# Patient Record
Sex: Female | Born: 1935 | Race: White | Hispanic: No | Smoking: Never smoker
Health system: Southern US, Community
[De-identification: ages and names within clinical notes are randomized; demographics above are authoritative.]

## PROBLEM LIST (undated history)

## (undated) DIAGNOSIS — M810 Age-related osteoporosis without current pathological fracture: Secondary | ICD-10-CM

## (undated) DIAGNOSIS — I10 Essential (primary) hypertension: Secondary | ICD-10-CM

## (undated) DIAGNOSIS — R2681 Unsteadiness on feet: Secondary | ICD-10-CM

## (undated) DIAGNOSIS — G40909 Epilepsy, unspecified, not intractable, without status epilepticus: Secondary | ICD-10-CM

## (undated) DIAGNOSIS — Z8509 Personal history of malignant neoplasm of other digestive organs: Secondary | ICD-10-CM

## (undated) DIAGNOSIS — E039 Hypothyroidism, unspecified: Secondary | ICD-10-CM

## (undated) DIAGNOSIS — J3800 Paralysis of vocal cords and larynx, unspecified: Secondary | ICD-10-CM

## (undated) DIAGNOSIS — K859 Acute pancreatitis without necrosis or infection, unspecified: Secondary | ICD-10-CM

## (undated) DIAGNOSIS — D329 Benign neoplasm of meninges, unspecified: Secondary | ICD-10-CM

## (undated) DIAGNOSIS — E222 Syndrome of inappropriate secretion of antidiuretic hormone: Secondary | ICD-10-CM

## (undated) DIAGNOSIS — C439 Malignant melanoma of skin, unspecified: Secondary | ICD-10-CM

## (undated) DIAGNOSIS — Z87440 Personal history of urinary (tract) infections: Secondary | ICD-10-CM

## (undated) DIAGNOSIS — I639 Cerebral infarction, unspecified: Secondary | ICD-10-CM

## (undated) HISTORY — DX: Hypothyroidism, unspecified: E03.9

## (undated) HISTORY — DX: Syndrome of inappropriate secretion of antidiuretic hormone: E22.2

## (undated) HISTORY — DX: Acute pancreatitis without necrosis or infection, unspecified: K85.90

## (undated) HISTORY — DX: Personal history of urinary (tract) infections: Z87.440

## (undated) HISTORY — DX: Essential (primary) hypertension: I10

## (undated) HISTORY — DX: Unsteadiness on feet: R26.81

## (undated) HISTORY — PX: WRIST FRACTURE SURGERY: SHX121

## (undated) HISTORY — DX: Benign neoplasm of meninges, unspecified: D32.9

## (undated) HISTORY — DX: Paralysis of vocal cords and larynx, unspecified: J38.00

## (undated) HISTORY — DX: Malignant melanoma of skin, unspecified: C43.9

## (undated) HISTORY — DX: Personal history of malignant neoplasm of other digestive organs: Z85.09

## (undated) HISTORY — DX: Age-related osteoporosis without current pathological fracture: M81.0

## (undated) HISTORY — DX: Cerebral infarction, unspecified: I63.9

## (undated) HISTORY — PX: BRAIN TUMOR EXCISION: SHX577

## (undated) HISTORY — PX: LARYNGOPLASTY: SHX282

## (undated) HISTORY — DX: Epilepsy, unspecified, not intractable, without status epilepticus: G40.909

## (undated) HISTORY — PX: ABDOMINAL HYSTERECTOMY: SHX81

---

## 1968-08-12 HISTORY — PX: THYROIDECTOMY, PARTIAL: SHX18

## 2001-07-24 ENCOUNTER — Encounter: Admission: RE | Admit: 2001-07-24 | Discharge: 2001-07-24 | Payer: Self-pay | Admitting: Otolaryngology

## 2001-07-24 ENCOUNTER — Encounter: Payer: Self-pay | Admitting: Otolaryngology

## 2002-04-23 ENCOUNTER — Encounter: Payer: Self-pay | Admitting: Otolaryngology

## 2002-04-23 ENCOUNTER — Encounter: Admission: RE | Admit: 2002-04-23 | Discharge: 2002-04-23 | Payer: Self-pay | Admitting: Otolaryngology

## 2002-04-26 ENCOUNTER — Ambulatory Visit (HOSPITAL_BASED_OUTPATIENT_CLINIC_OR_DEPARTMENT_OTHER): Admission: RE | Admit: 2002-04-26 | Discharge: 2002-04-27 | Payer: Self-pay | Admitting: Otolaryngology

## 2002-09-16 ENCOUNTER — Encounter: Admission: RE | Admit: 2002-09-16 | Discharge: 2002-09-16 | Payer: Self-pay | Admitting: Otolaryngology

## 2002-12-30 ENCOUNTER — Encounter: Payer: Self-pay | Admitting: Gastroenterology

## 2003-01-15 ENCOUNTER — Emergency Department (HOSPITAL_COMMUNITY): Admission: EM | Admit: 2003-01-15 | Discharge: 2003-01-15 | Payer: Self-pay | Admitting: *Deleted

## 2003-01-15 ENCOUNTER — Encounter: Payer: Self-pay | Admitting: *Deleted

## 2003-05-13 ENCOUNTER — Encounter: Payer: Self-pay | Admitting: Internal Medicine

## 2003-05-13 ENCOUNTER — Encounter: Admission: RE | Admit: 2003-05-13 | Discharge: 2003-05-13 | Payer: Self-pay | Admitting: Internal Medicine

## 2003-08-26 ENCOUNTER — Encounter: Admission: RE | Admit: 2003-08-26 | Discharge: 2003-08-26 | Payer: Self-pay | Admitting: Internal Medicine

## 2004-04-13 ENCOUNTER — Ambulatory Visit (HOSPITAL_COMMUNITY): Admission: RE | Admit: 2004-04-13 | Discharge: 2004-04-13 | Payer: Self-pay | Admitting: Internal Medicine

## 2004-08-11 ENCOUNTER — Emergency Department (HOSPITAL_COMMUNITY): Admission: EM | Admit: 2004-08-11 | Discharge: 2004-08-11 | Payer: Self-pay

## 2005-08-12 HISTORY — PX: GASTRECTOMY: SHX58

## 2005-11-07 ENCOUNTER — Encounter: Admission: RE | Admit: 2005-11-07 | Discharge: 2005-11-07 | Payer: Self-pay | Admitting: Neurology

## 2006-06-25 ENCOUNTER — Inpatient Hospital Stay (HOSPITAL_COMMUNITY): Admission: EM | Admit: 2006-06-25 | Discharge: 2006-07-14 | Payer: Self-pay | Admitting: Emergency Medicine

## 2006-06-27 ENCOUNTER — Encounter: Payer: Self-pay | Admitting: Gastroenterology

## 2006-06-27 ENCOUNTER — Encounter (INDEPENDENT_AMBULATORY_CARE_PROVIDER_SITE_OTHER): Payer: Self-pay | Admitting: *Deleted

## 2006-06-27 DIAGNOSIS — K2990 Gastroduodenitis, unspecified, without bleeding: Secondary | ICD-10-CM

## 2006-06-27 DIAGNOSIS — K2970 Gastritis, unspecified, without bleeding: Secondary | ICD-10-CM | POA: Insufficient documentation

## 2006-06-30 ENCOUNTER — Encounter: Payer: Self-pay | Admitting: Gastroenterology

## 2006-06-30 ENCOUNTER — Encounter (INDEPENDENT_AMBULATORY_CARE_PROVIDER_SITE_OTHER): Payer: Self-pay | Admitting: Specialist

## 2006-07-02 ENCOUNTER — Encounter (INDEPENDENT_AMBULATORY_CARE_PROVIDER_SITE_OTHER): Payer: Self-pay | Admitting: Specialist

## 2006-07-09 ENCOUNTER — Ambulatory Visit: Payer: Self-pay | Admitting: Internal Medicine

## 2007-01-03 ENCOUNTER — Emergency Department (HOSPITAL_COMMUNITY): Admission: EM | Admit: 2007-01-03 | Discharge: 2007-01-03 | Payer: Self-pay | Admitting: Emergency Medicine

## 2007-08-15 IMAGING — CR DG CHEST 1V PORT
1 series · 1 of 1 positions shown · non-contrast
Comparison: 07/01/06

CLINICAL DATA: Recent gastric tumor resection.  Check for fluid overload.  
 PORTABLE CHEST ? 1 VIEW:

[view not recorded]
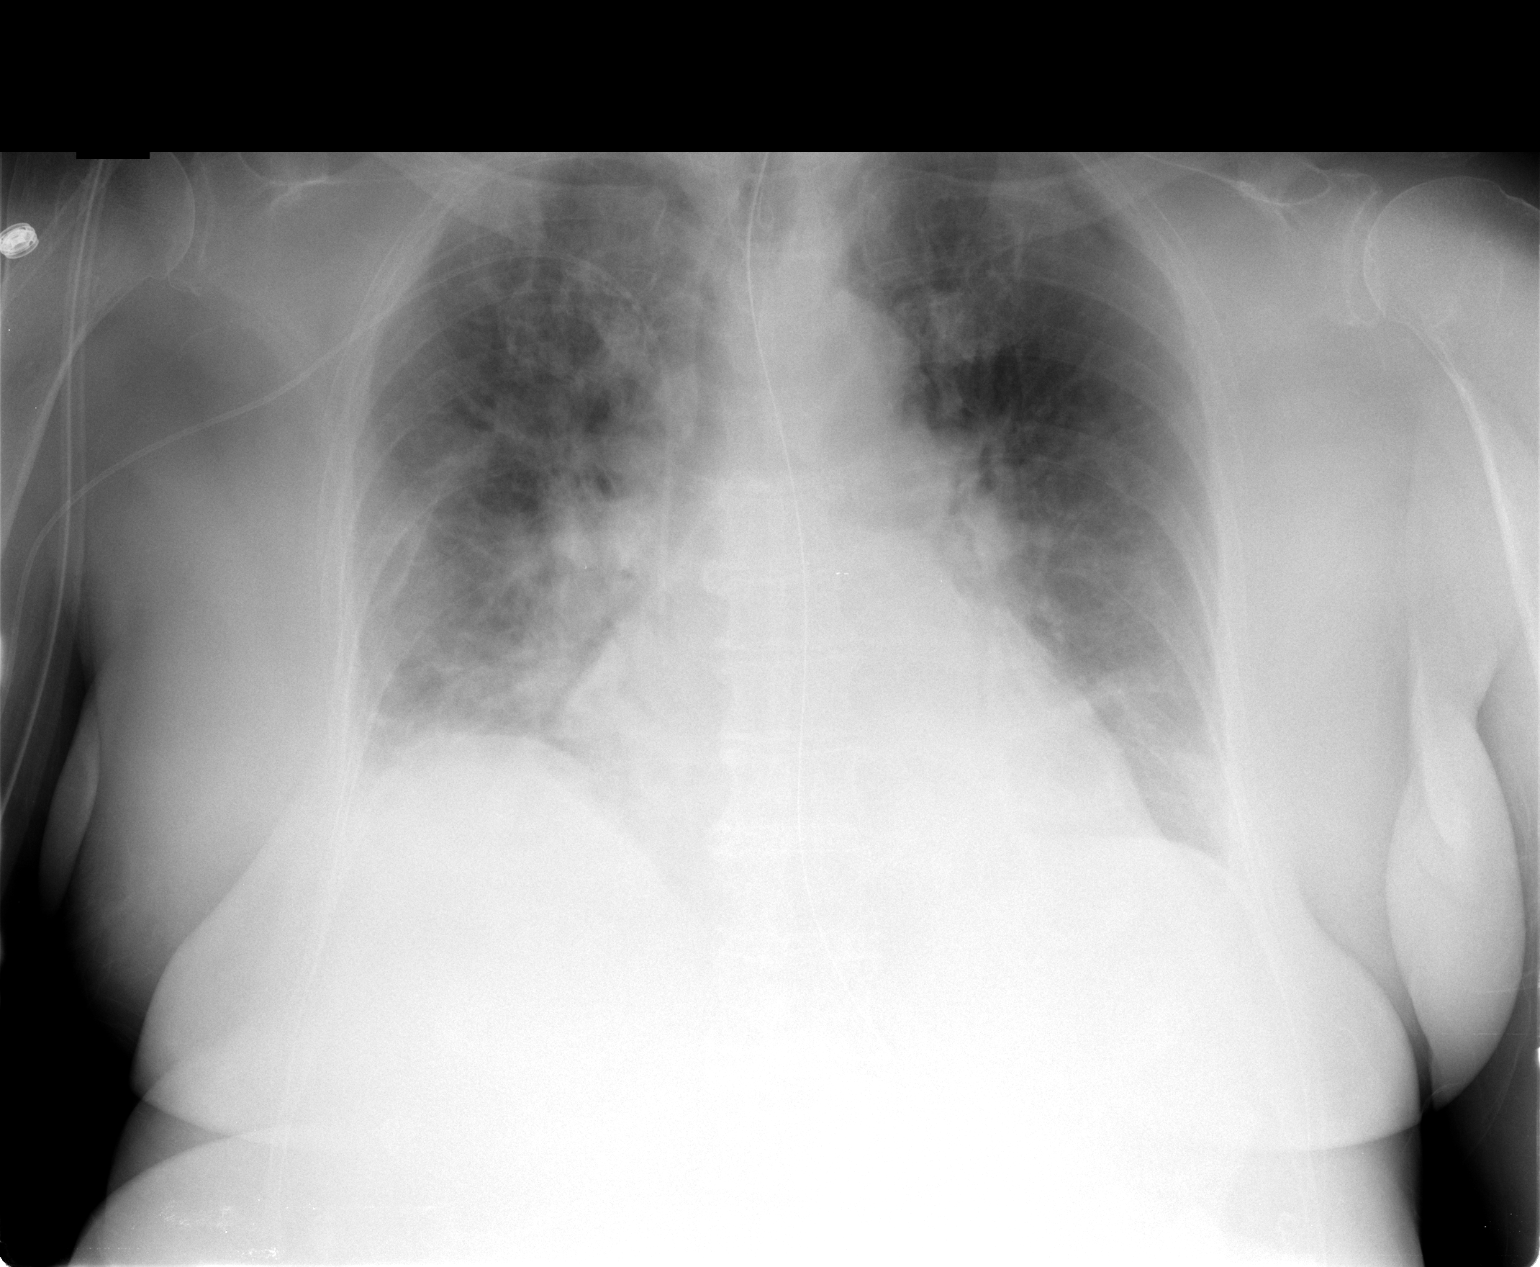

[1 of 1 positions shown; findings below may reference images not displayed]

FINDINGS: Right sided PICC line terminates over the low SVC just above the superior caval/atrial junction.  There has been placement of a nasogastric tube.  This extends beyond the inferior aspect of the film.  Cardiomegaly.  Development of moderate interstitial edema with right and likely left pleural effusions.  No pneumothorax.  Right base atelectasis.
IMPRESSION: 1.  Development of moderate interstitial edema with probable bilateral pleural effusions.  
 2.  Right base atelectasis.

## 2007-08-17 IMAGING — CR DG CHEST 2V
2 series · 2 of 2 positions shown · non-contrast
Comparison: 07/05/06.

CLINICAL DATA: Pulmonary edema.  Partial gastrectomy for GI bleed and gastric tumor.  
 CHEST - 2 VIEW:

[w chest pa]
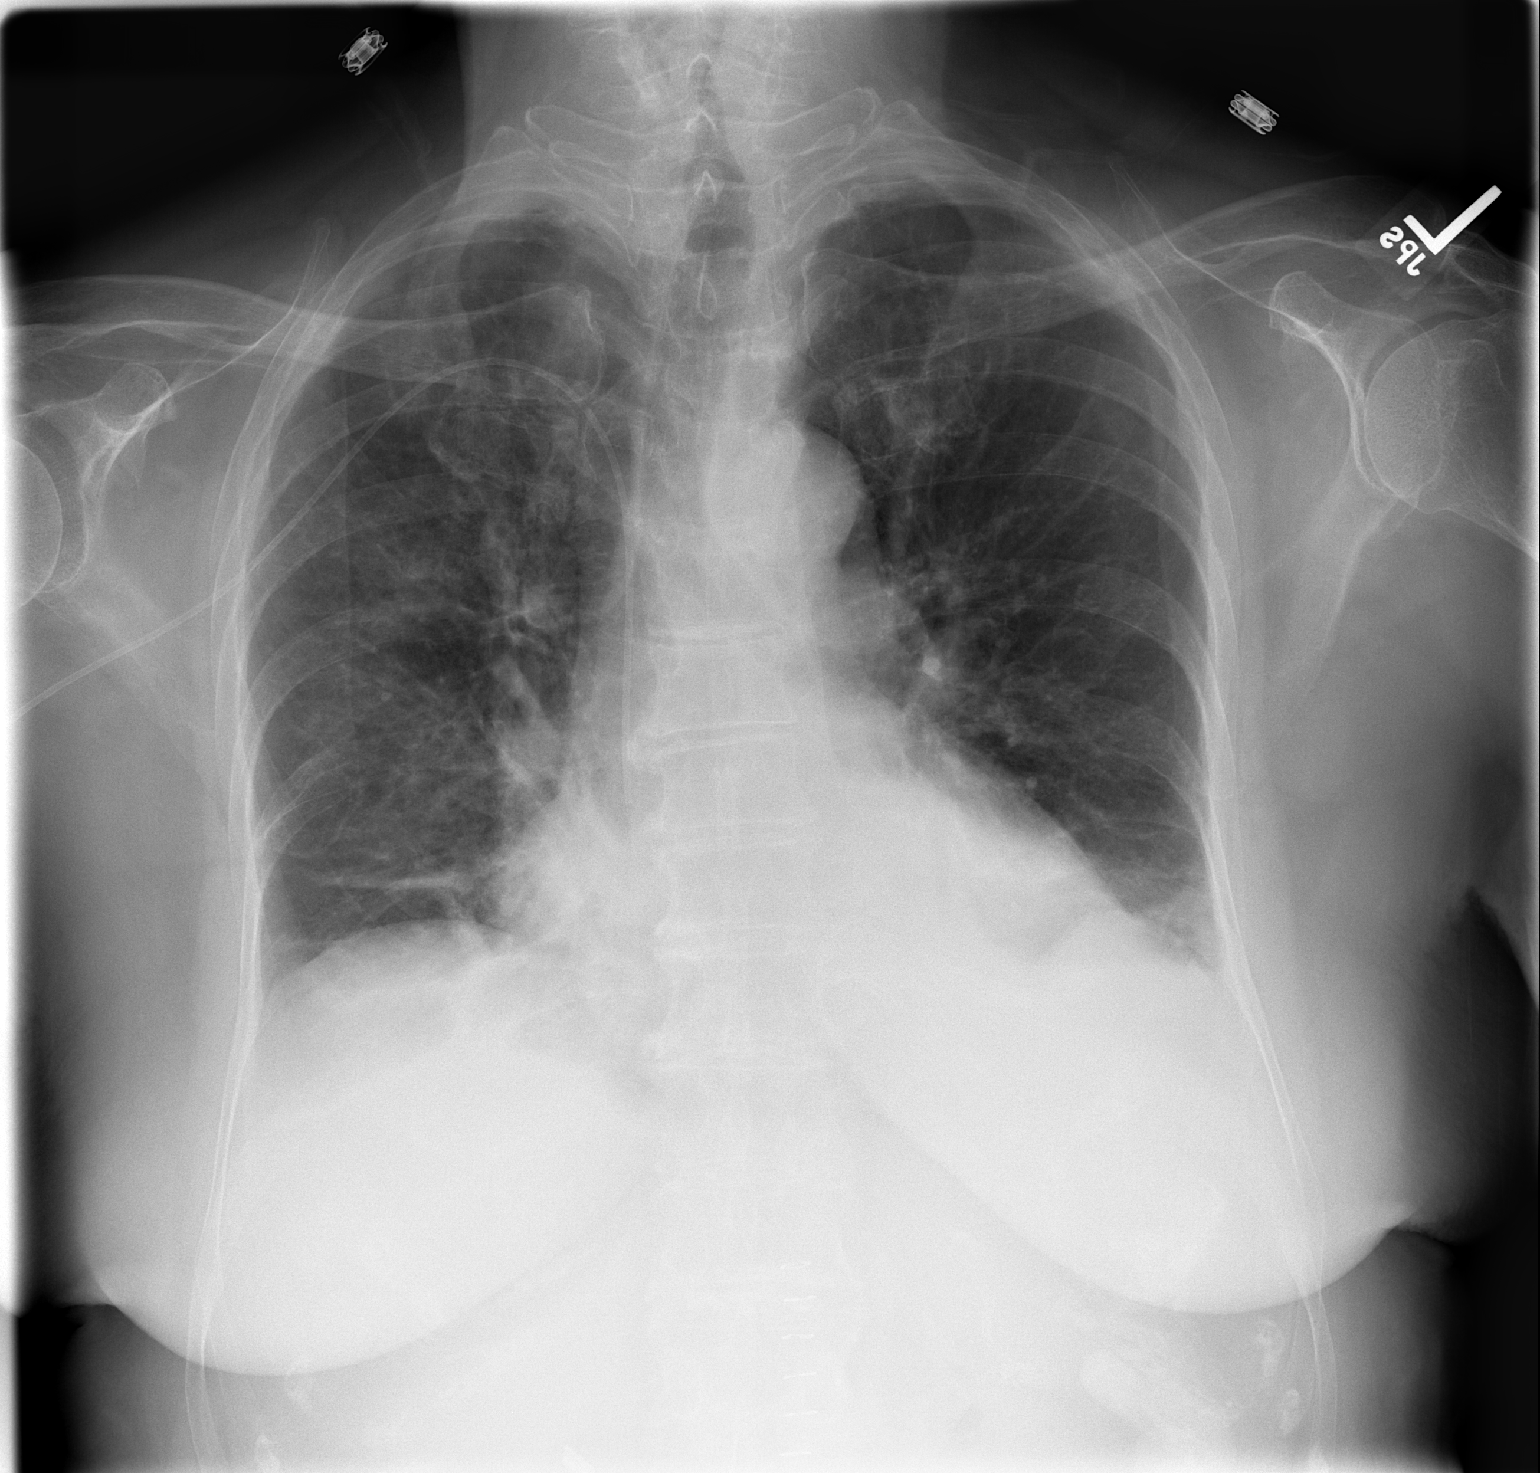

[w chest lat]
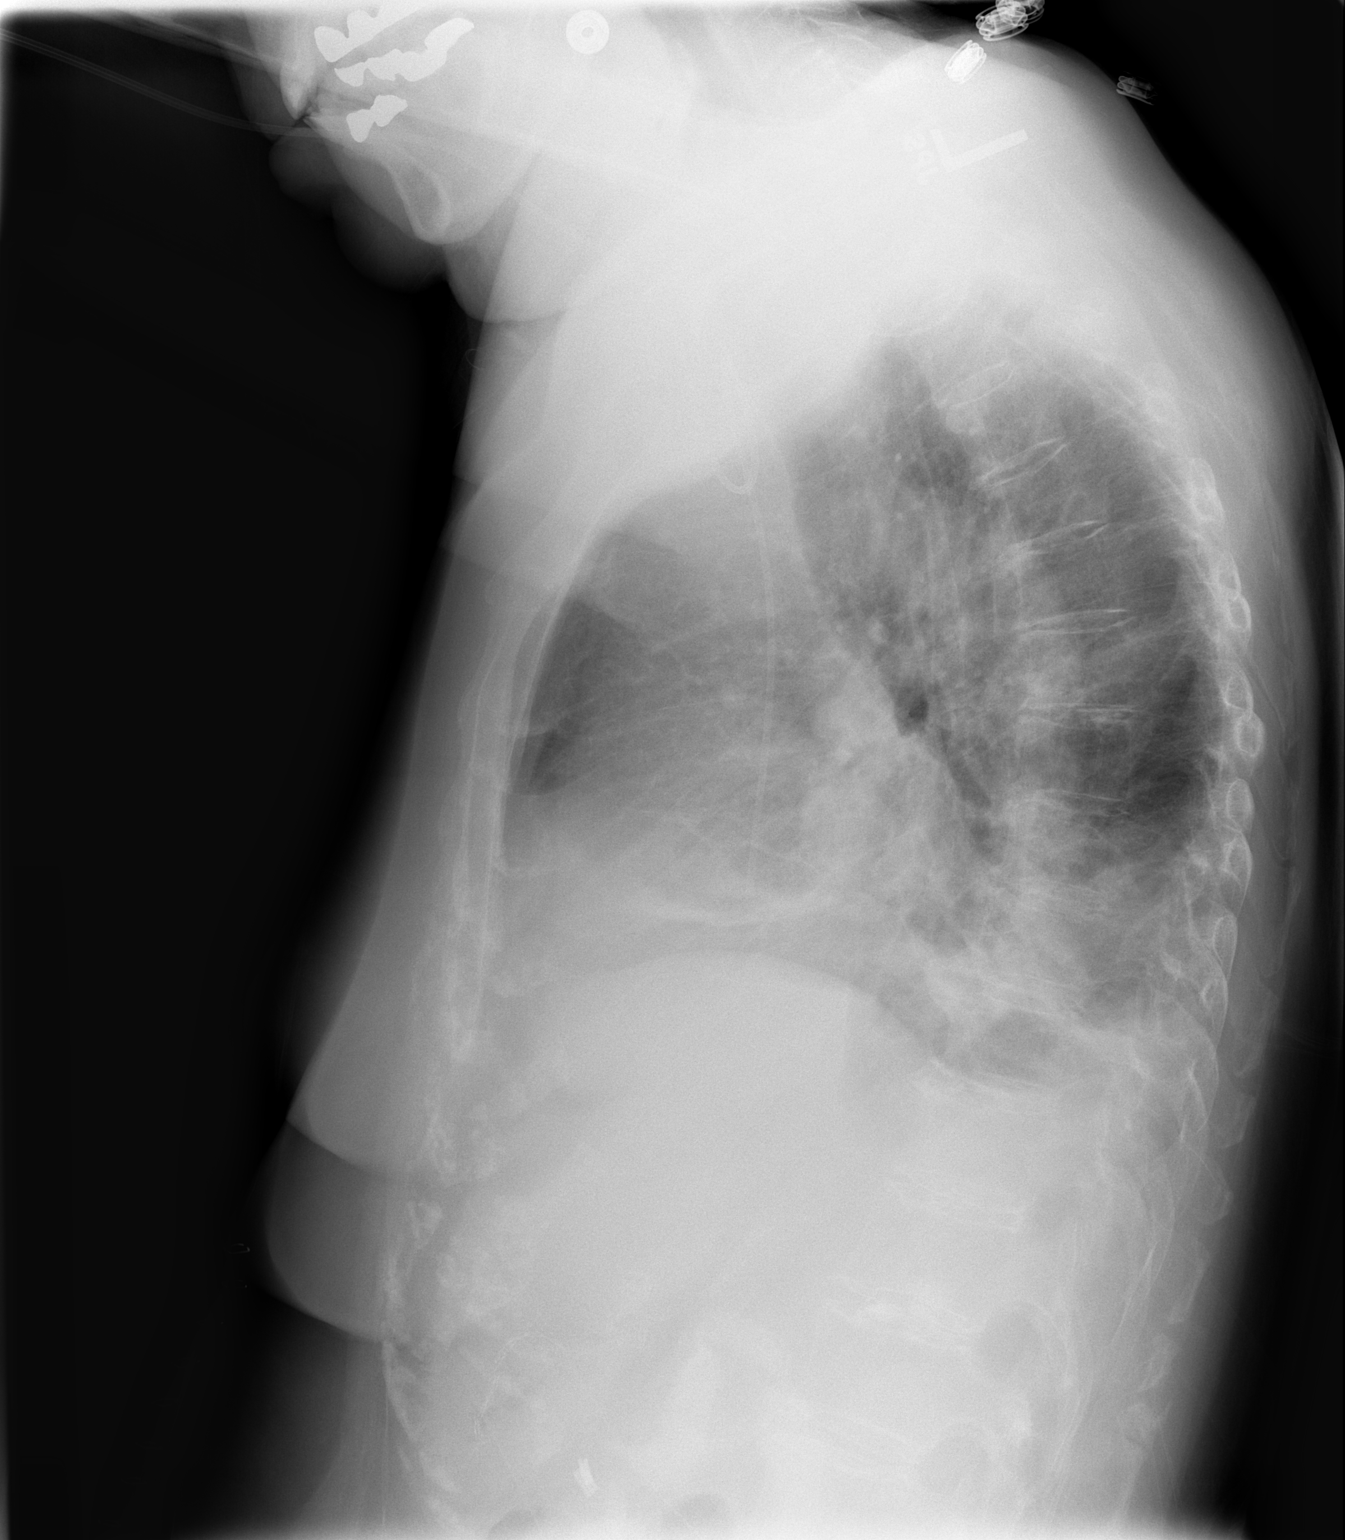

[2 of 2 positions shown; findings below may reference images not displayed]

FINDINGS: Pulmonary edema has resolved.  Underlying chronic lung disease and bibasilar atelectasis remain.  There are small bilateral pleural effusions, left greater than right.  Stable cardiomegaly.
IMPRESSION: Resolution of pulmonary edema.  Bilateral pleural effusions remain present, which are relatively small.  Bibasilar atelectasis.

## 2007-08-18 IMAGING — CR DG ABDOMEN 2V
2 series · 2 of 2 positions shown · non-contrast
Comparison: Preoperative abdominal CT 06/27/06.

CLINICAL DATA: Postop incisional pain. Question ileus vs obstruction.
 ABDOMEN ? 2 VIEW:

[t abdomen supine]
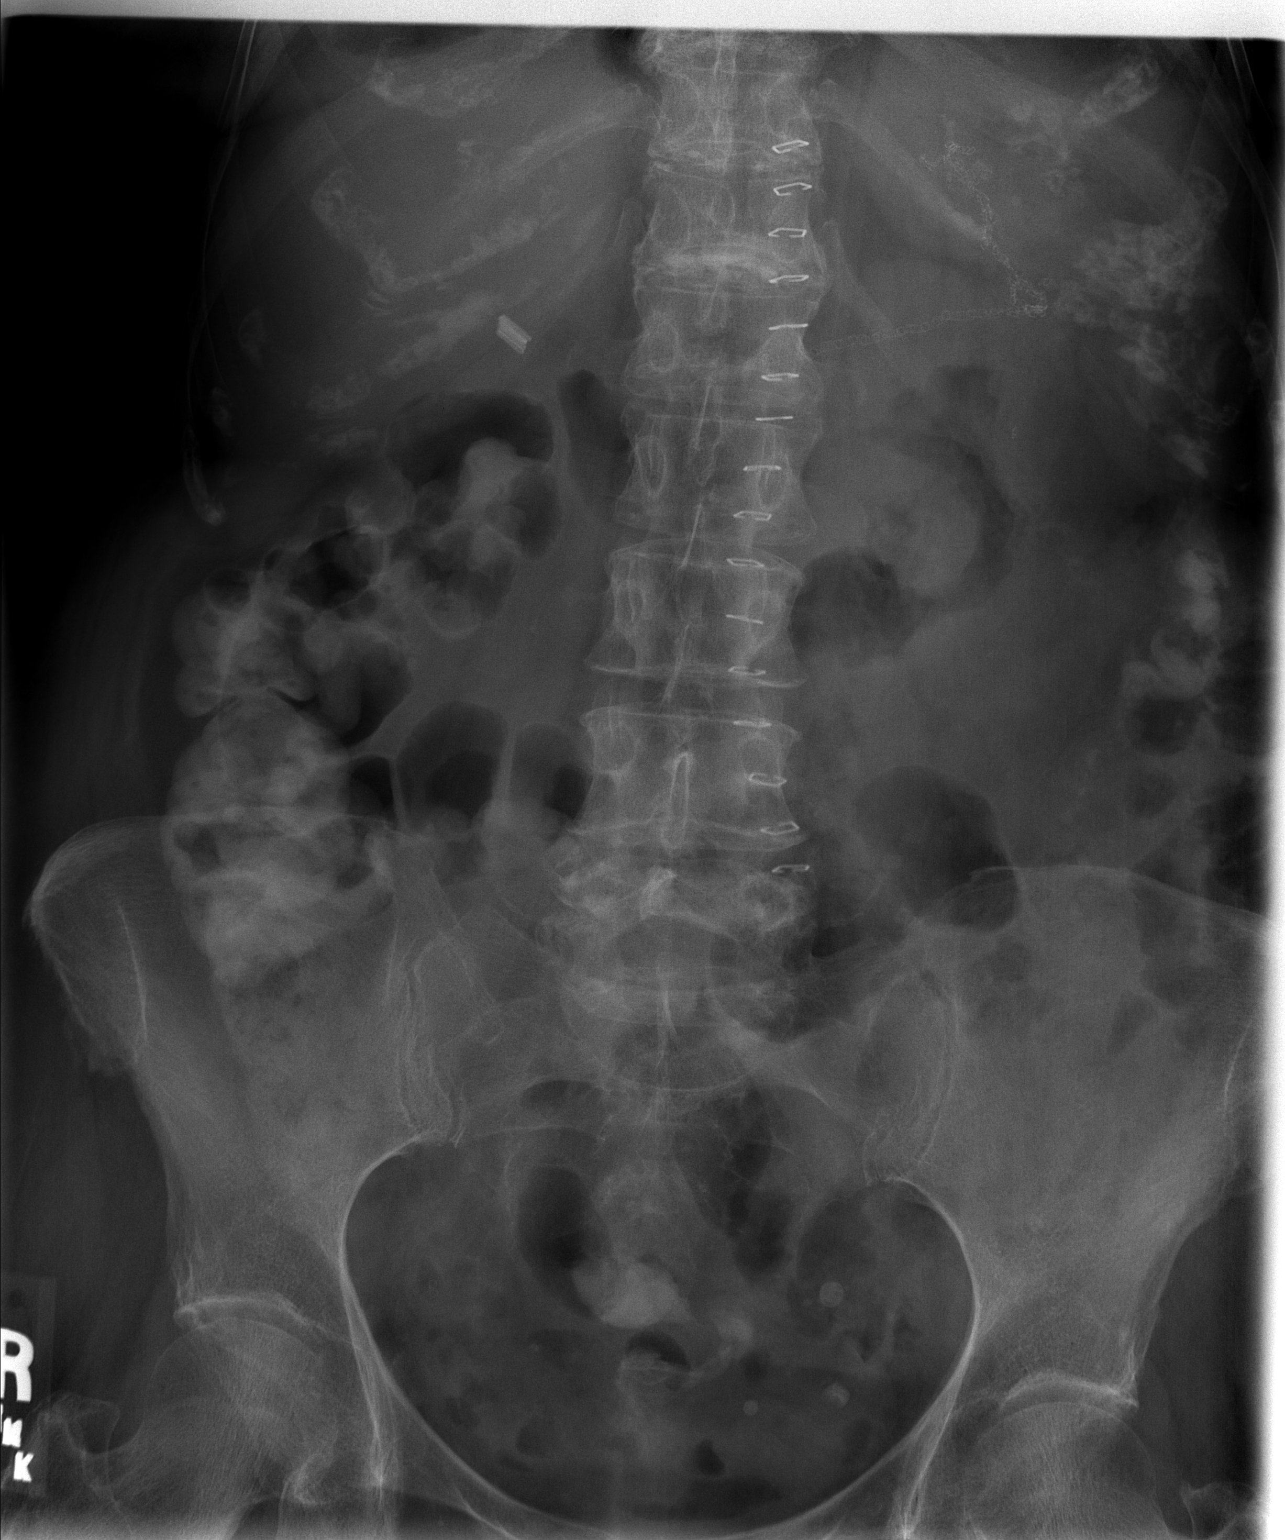

[w abdomen upright]
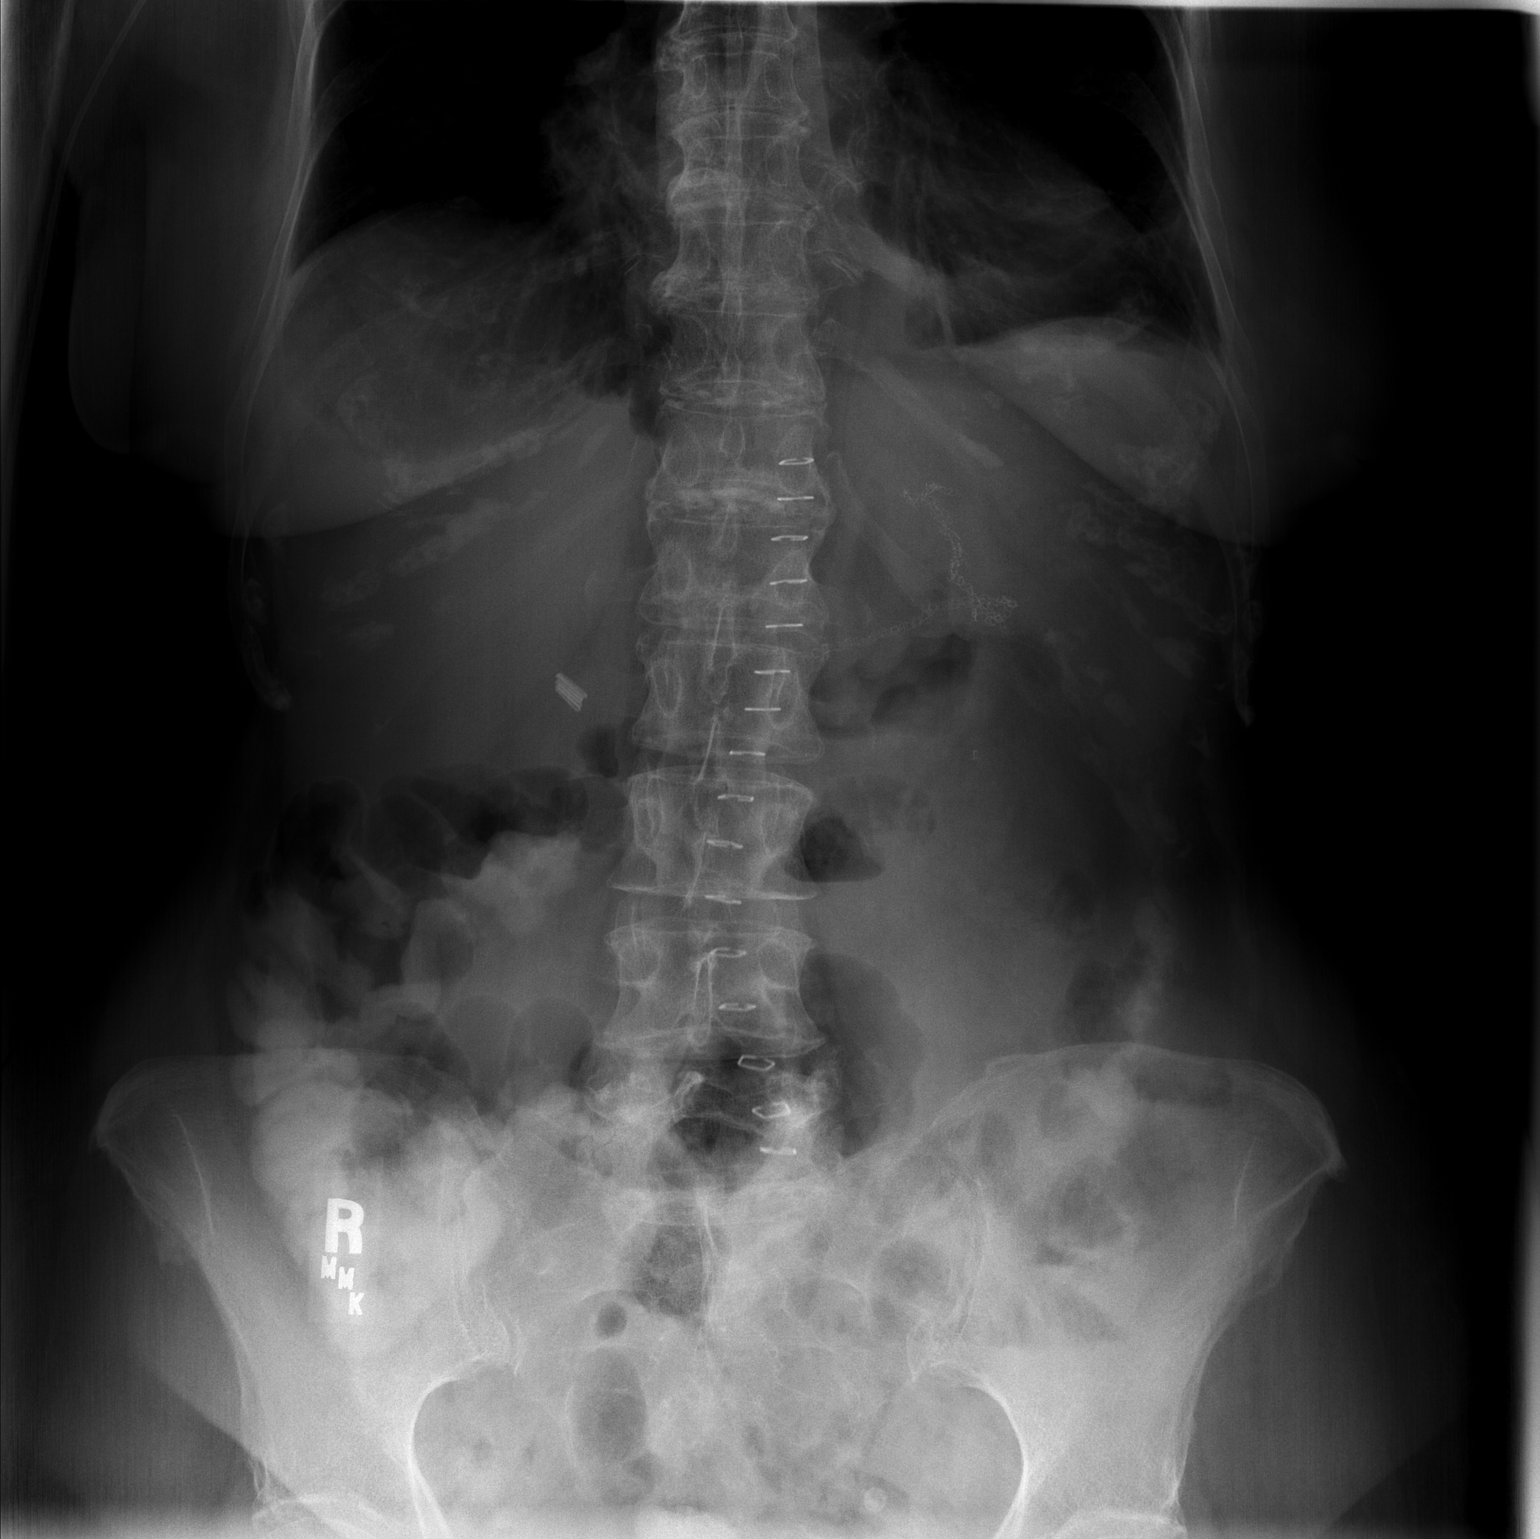

[2 of 2 positions shown; findings below may reference images not displayed]

FINDINGS: There are midline anterior abdominal wall staples with surgical clips in the left upper quadrant of the abdomen.  The bowel gas pattern is normal. There is no free intraperitoneal air.  Some residual bowel contrast is noted in the colon.  There are bilateral pelvic phleboliths.
IMPRESSION: Normal bowel gas pattern status post recent abdominal surgery for left upper quadrant mass. No evidence of obstruction.

## 2007-10-11 HISTORY — PX: CHOLECYSTECTOMY: SHX55

## 2007-12-08 ENCOUNTER — Ambulatory Visit: Payer: Self-pay | Admitting: Gastroenterology

## 2007-12-08 DIAGNOSIS — R110 Nausea: Secondary | ICD-10-CM

## 2007-12-08 DIAGNOSIS — D131 Benign neoplasm of stomach: Secondary | ICD-10-CM

## 2007-12-08 DIAGNOSIS — R1310 Dysphagia, unspecified: Secondary | ICD-10-CM | POA: Insufficient documentation

## 2007-12-17 ENCOUNTER — Encounter: Payer: Self-pay | Admitting: Gastroenterology

## 2007-12-17 ENCOUNTER — Ambulatory Visit: Payer: Self-pay | Admitting: Gastroenterology

## 2007-12-17 LAB — CONVERTED CEMR LAB: BUN: 11 mg/dL (ref 6–23)

## 2007-12-21 ENCOUNTER — Encounter: Payer: Self-pay | Admitting: Gastroenterology

## 2007-12-28 ENCOUNTER — Ambulatory Visit: Payer: Self-pay | Admitting: Cardiology

## 2008-01-13 ENCOUNTER — Ambulatory Visit: Payer: Self-pay | Admitting: Gastroenterology

## 2008-09-04 ENCOUNTER — Emergency Department (HOSPITAL_COMMUNITY): Admission: EM | Admit: 2008-09-04 | Discharge: 2008-09-04 | Payer: Self-pay | Admitting: Emergency Medicine

## 2009-03-16 ENCOUNTER — Encounter: Admission: RE | Admit: 2009-03-16 | Discharge: 2009-03-16 | Payer: Self-pay | Admitting: Internal Medicine

## 2009-04-18 ENCOUNTER — Encounter: Admission: RE | Admit: 2009-04-18 | Discharge: 2009-04-18 | Payer: Self-pay | Admitting: Internal Medicine

## 2009-04-21 ENCOUNTER — Ambulatory Visit: Payer: Self-pay | Admitting: Surgery

## 2009-04-21 ENCOUNTER — Encounter (INDEPENDENT_AMBULATORY_CARE_PROVIDER_SITE_OTHER): Payer: Self-pay | Admitting: Emergency Medicine

## 2009-04-21 ENCOUNTER — Inpatient Hospital Stay (HOSPITAL_COMMUNITY): Admission: EM | Admit: 2009-04-21 | Discharge: 2009-04-24 | Payer: Self-pay | Admitting: Emergency Medicine

## 2009-04-22 ENCOUNTER — Encounter (INDEPENDENT_AMBULATORY_CARE_PROVIDER_SITE_OTHER): Payer: Self-pay | Admitting: Internal Medicine

## 2009-08-12 HISTORY — PX: CATARACT EXTRACTION, BILATERAL: SHX1313

## 2009-08-12 HISTORY — PX: MELANOMA EXCISION: SHX5266

## 2009-09-27 ENCOUNTER — Ambulatory Visit: Admission: RE | Admit: 2009-09-27 | Discharge: 2009-09-27 | Payer: Self-pay | Admitting: Gynecologic Oncology

## 2009-10-03 ENCOUNTER — Ambulatory Visit (HOSPITAL_BASED_OUTPATIENT_CLINIC_OR_DEPARTMENT_OTHER): Admission: RE | Admit: 2009-10-03 | Discharge: 2009-10-03 | Payer: Self-pay | Admitting: General Surgery

## 2009-10-24 ENCOUNTER — Ambulatory Visit (HOSPITAL_COMMUNITY): Admission: RE | Admit: 2009-10-24 | Discharge: 2009-10-24 | Payer: Self-pay | Admitting: Gynecologic Oncology

## 2009-11-22 ENCOUNTER — Ambulatory Visit: Admission: RE | Admit: 2009-11-22 | Discharge: 2009-11-22 | Payer: Self-pay | Admitting: Gynecologic Oncology

## 2010-10-31 LAB — PROTIME-INR: Prothrombin Time: 13.3 seconds (ref 11.6–15.2)

## 2010-10-31 LAB — BASIC METABOLIC PANEL
BUN: 12 mg/dL (ref 6–23)
Chloride: 101 mEq/L (ref 96–112)
Creatinine, Ser: 0.7 mg/dL (ref 0.4–1.2)
GFR calc non Af Amer: >60 mL/min (ref >60)
Glucose, Bld: 97 mg/dL (ref 70–99)
Potassium: 3.7 mEq/L (ref 3.5–5.1)

## 2010-10-31 LAB — DIFFERENTIAL
Basophils Absolute: 0 10*3/uL (ref 0.0–0.1)
Eosinophils Absolute: 0.2 10*3/uL (ref 0.0–0.7)
Eosinophils Relative: 4 % (ref 0–5)

## 2010-10-31 LAB — CBC
HCT: 35.7 % — ABNORMAL LOW (ref 36.0–46.0)
MCV: 99.1 fL (ref 78.0–100.0)
Platelets: 183 10*3/uL (ref 150–400)
RDW: 13.5 % (ref 11.5–15.5)

## 2010-11-01 ENCOUNTER — Ambulatory Visit
Admission: RE | Admit: 2010-11-01 | Discharge: 2010-11-01 | Disposition: A | Discharge status: Home / Self Care | Payer: Medicare Other | Source: Ambulatory Visit | Attending: Neurology | Admitting: Neurology

## 2010-11-01 ENCOUNTER — Other Ambulatory Visit: Payer: Self-pay | Admitting: Neurology

## 2010-11-01 DIAGNOSIS — T887XXA Unspecified adverse effect of drug or medicament, initial encounter: Secondary | ICD-10-CM

## 2010-11-01 DIAGNOSIS — S0990XA Unspecified injury of head, initial encounter: Secondary | ICD-10-CM

## 2010-11-01 DIAGNOSIS — S060XAA Concussion with loss of consciousness status unknown, initial encounter: Secondary | ICD-10-CM

## 2010-11-01 DIAGNOSIS — S060X9A Concussion with loss of consciousness of unspecified duration, initial encounter: Secondary | ICD-10-CM

## 2010-11-01 DIAGNOSIS — S06339A Contusion and laceration of cerebrum, unspecified, with loss of consciousness of unspecified duration, initial encounter: Secondary | ICD-10-CM

## 2010-11-01 DIAGNOSIS — R51 Headache: Secondary | ICD-10-CM

## 2010-11-01 DIAGNOSIS — I6200 Nontraumatic subdural hemorrhage, unspecified: Secondary | ICD-10-CM

## 2010-11-01 DIAGNOSIS — G40219 Localization-related (focal) (partial) symptomatic epilepsy and epileptic syndromes with complex partial seizures, intractable, without status epilepticus: Secondary | ICD-10-CM

## 2010-11-01 DIAGNOSIS — D332 Benign neoplasm of brain, unspecified: Secondary | ICD-10-CM

## 2010-11-01 DIAGNOSIS — G40309 Generalized idiopathic epilepsy and epileptic syndromes, not intractable, without status epilepticus: Secondary | ICD-10-CM

## 2010-11-04 LAB — COMPREHENSIVE METABOLIC PANEL
AST: 18 U/L (ref 0–37)
Albumin: 3.9 g/dL (ref 3.5–5.2)
Calcium: 8.4 mg/dL (ref 8.4–10.5)
Creatinine, Ser: 0.75 mg/dL (ref 0.4–1.2)
GFR calc Af Amer: >60 mL/min (ref >60)
GFR calc non Af Amer: >60 mL/min (ref >60)
Total Protein: 6.7 g/dL (ref 6.0–8.3)

## 2010-11-04 LAB — CBC
MCHC: 32.6 g/dL (ref 30.0–36.0)
MCV: 99.7 fL (ref 78.0–100.0)
Platelets: 173 10*3/uL (ref 150–400)
RDW: 13.1 % (ref 11.5–15.5)

## 2010-11-04 LAB — DIFFERENTIAL
Eosinophils Relative: 4 % (ref 0–5)
Lymphocytes Relative: 16 % (ref 12–46)
Lymphs Abs: 0.9 10*3/uL (ref 0.7–4.0)
Monocytes Absolute: 0.6 10*3/uL (ref 0.1–1.0)

## 2010-11-05 ENCOUNTER — Other Ambulatory Visit: Payer: Self-pay

## 2010-11-16 LAB — URINALYSIS, ROUTINE W REFLEX MICROSCOPIC
Bilirubin Urine: NEGATIVE
Glucose, UA: NEGATIVE mg/dL
Glucose, UA: NEGATIVE mg/dL
Ketones, ur: NEGATIVE mg/dL
Nitrite: NEGATIVE
Specific Gravity, Urine: 1.008 (ref 1.005–1.030)
Specific Gravity, Urine: 1.012 (ref 1.005–1.030)
pH: 7 (ref 5.0–8.0)
pH: 8 (ref 5.0–8.0)

## 2010-11-16 LAB — PHENYTOIN LEVEL, TOTAL
Phenytoin Lvl: 12.6 ug/mL (ref 10.0–20.0)
Phenytoin Lvl: 5.7 ug/mL — ABNORMAL LOW (ref 10.0–20.0)

## 2010-11-16 LAB — BASIC METABOLIC PANEL
CO2: 22 mEq/L (ref 19–32)
Chloride: 96 mEq/L (ref 96–112)
Creatinine, Ser: 0.57 mg/dL (ref 0.4–1.2)
GFR calc Af Amer: >60 mL/min (ref >60)

## 2010-11-16 LAB — URINE CULTURE
Colony Count: NO GROWTH
Culture: NO GROWTH

## 2010-11-16 LAB — URINE MICROSCOPIC-ADD ON

## 2010-11-16 LAB — HEPATIC FUNCTION PANEL
ALT: 14 U/L (ref 0–35)
AST: 24 U/L (ref 0–37)
Alkaline Phosphatase: 103 U/L (ref 39–117)
Bilirubin, Direct: 0.1 mg/dL (ref 0.0–0.3)
Indirect Bilirubin: 0.3 mg/dL (ref 0.3–0.9)
Total Bilirubin: 0.4 mg/dL (ref 0.3–1.2)

## 2010-11-16 LAB — COMPREHENSIVE METABOLIC PANEL
ALT: 12 U/L (ref 0–35)
Albumin: 3.6 g/dL (ref 3.5–5.2)
Alkaline Phosphatase: 84 U/L (ref 39–117)
Calcium: 8.1 mg/dL — ABNORMAL LOW (ref 8.4–10.5)
GFR calc Af Amer: >60 mL/min (ref >60)
Potassium: 3.8 mEq/L (ref 3.5–5.1)
Sodium: 135 mEq/L (ref 135–145)
Total Protein: 5.8 g/dL — ABNORMAL LOW (ref 6.0–8.3)

## 2010-11-16 LAB — DIFFERENTIAL
Basophils Relative: 1 % (ref 0–1)
Eosinophils Absolute: 0.1 10*3/uL (ref 0.0–0.7)
Eosinophils Relative: 2 % (ref 0–5)
Monocytes Absolute: 0.4 10*3/uL (ref 0.1–1.0)
Monocytes Relative: 10 % (ref 3–12)
Neutrophils Relative %: 72 % (ref 43–77)

## 2010-11-16 LAB — LIPID PANEL
Cholesterol: 226 mg/dL — ABNORMAL HIGH (ref 0–200)
HDL: 111 mg/dL (ref >39)
Total CHOL/HDL Ratio: 2 RATIO
VLDL: 8 mg/dL (ref 0–40)

## 2010-11-16 LAB — CBC
MCHC: 34.6 g/dL (ref 30.0–36.0)
MCV: 97 fL (ref 78.0–100.0)
RBC: 3.94 MIL/uL (ref 3.87–5.11)

## 2010-11-26 LAB — URINALYSIS, ROUTINE W REFLEX MICROSCOPIC
Bilirubin Urine: NEGATIVE
Glucose, UA: NEGATIVE mg/dL
Hgb urine dipstick: NEGATIVE
Specific Gravity, Urine: 1.022 (ref 1.005–1.030)

## 2010-11-26 LAB — DIFFERENTIAL
Basophils Absolute: 0 10*3/uL (ref 0.0–0.1)
Lymphocytes Relative: 7 % — ABNORMAL LOW (ref 12–46)
Lymphs Abs: 0.6 10*3/uL — ABNORMAL LOW (ref 0.7–4.0)
Monocytes Absolute: 0.9 10*3/uL (ref 0.1–1.0)
Monocytes Relative: 10 % (ref 3–12)
Neutro Abs: 6.9 10*3/uL (ref 1.7–7.7)

## 2010-11-26 LAB — BASIC METABOLIC PANEL
Chloride: 91 mEq/L — ABNORMAL LOW (ref 96–112)
GFR calc non Af Amer: >60 mL/min (ref >60)
Potassium: 4.4 mEq/L (ref 3.5–5.1)
Sodium: 128 mEq/L — ABNORMAL LOW (ref 135–145)

## 2010-11-26 LAB — URINE MICROSCOPIC-ADD ON

## 2010-11-26 LAB — CBC
HCT: 36.6 % (ref 36.0–46.0)
Hemoglobin: 12.5 g/dL (ref 12.0–15.0)
MCV: 96.6 fL (ref 78.0–100.0)
RBC: 3.79 MIL/uL — ABNORMAL LOW (ref 3.87–5.11)
WBC: 8.4 10*3/uL (ref 4.0–10.5)

## 2010-12-17 ENCOUNTER — Other Ambulatory Visit (HOSPITAL_COMMUNITY): Payer: Medicare Other

## 2010-12-28 NOTE — Consult Note (Signed)
   NAME:  MELISS, FLEEK NO.:  [id number]   MEDICAL RECORD NO.:  [id number]                   PATIENT TYPE:  EMS   LOCATION:  ED                                   FAC[facility]   PHYSICIAN:  Artist Pais. Mina Marble, M.D.           DATE OF BIRTH:  01-05-1930   DATE OF CONSULTATION:  01/15/2003  DATE OF DISCHARGE:                                   CONSULTATION   REPORT TITLE:  ORTHOPEDIC CONSULTATION.   REFERRING PHYSICIAN:  Sheppard Penton. Stacie Acres, M.D.   REASON FOR CONSULTATION:  This is a 77 year old female who is right-hand  dominant, who fell earlier today and sustained injury to her left wrist.  I  am consulted for this.  She has also had an injury to her head with some  questionable loss of consciousness and also an injury to her right ankle.   PAST MEDICAL HISTORY:  Significant for surgery on her left elbow in the  past.  Dr. Merlyn Lot has taken care of her for other problems in the past.  She  is unable to provide an adequate medical history.   PHYSICAL EXAMINATION:  EXTREMITIES:  Reveal pain and swelling dorsally over  her wrist on the left side.  She could make a composite fist, but wrist  range of motion is decreased and painful, as well as digital range of  motion.  Again there is some swelling.   RADIOLOGICAL DATA:  X-rays show a distal radius and ulnar fracture with some  slight shorting and slight dorsal angulation.  I discussed with the patient,  at this point in time, the recommendation to be due to the nature of her  fall, other injuries, and the fact that she may spend the night for  observation.  It is reasonable to put her in a splint.  Her films, at this  point in time, are acceptable.  It is her nondominant side; however, should  this shift, this may require operative fixation.  She was placed in a well-  padded sugar tong splint and will follow up in my office in the next 3-5  days for repeat film.              Artist Pais Mina Marble, M.D.    MAW/MEDQ  D:  01/15/2003  T:  01/15/2003  Job:  [id number]   cc:   Sheppard Penton. Stacie Acres, M.D.  [address] [place]  [place] [address]  Fax: [phone]

## 2010-12-28 NOTE — H&P (Signed)
NAME:  Hailey Faulkner, Hailey Faulkner NO.:  [id number]   MEDICAL RECORD NO.:  [id number]          PATIENT TYPE:  EMS   LOCATION:  MAJO                         FACILITY:  [facility]   PHYSICIAN:  Lonia Blood, M.D.DATE OF BIRTH:  12/05/1929   DATE OF ADMISSION:  06/25/2006  DATE OF DISCHARGE:                              HISTORY & PHYSICAL   PRIMARY CARE PHYSICIAN:  Erskine Speed, M.D.   CHIEF COMPLAINT:  Weakness with known acute blood loss anemia.   HISTORY OF PRESENT ILLNESS:  Ms. Hailey Faulkner is a very pleasant 77-  year-old female with a medical history as detailed below.  She has been  in her usual state of health until approximately 48 hours ago.  Then she  began to feel significantly weak.  She reports some lightheadedness when  attempting to stand and ambulate.  This was a significant change for  her.  This was associated with a generalized crampy upper abdominal pain  and some low grade nausea.  This eliminated her appetite.  There has not  been significant vomiting.  With this, however, the patient has also  developed melanotic stools.  She does not have a prior history of this  per her recollection.  She presented to her primary care physician's  office yesterday for evaluation of this.  Dr. Chilton Si noted that she did  appear to be somewhat pale.  He, therefore, checked a CBC.  Results came  back today revealing a hemoglobin of 7.8.  A baseline hemoglobin was  noted to be 13.5 in January of this year.  As a result, he contacted her  and arranged for her presentation to the emergency room.   I have evaluated the patient in the [facility]  Emergency Room.  She is indeed pale.  She reported generalized weakness  but denies focal weakness.  She is alert and orientated and there is no  confusion.  She has not had diarrhea.  She has had melena as noted.  There has been no vomiting in the emergency room but she has had some  significant nausea.   She denies use of nonsteroidal anti-inflammatory  drugs.  She denies abdominal trauma.  She denies prior colonoscopy or  EGD.   REVIEW OF SYSTEMS:  Comprehensive review of systems is unremarkable with  the exception of the multiple positive elements noted in the history of  present illness above.   MEDICATIONS:  1. Synthroid 125 mcg daily.  2. Aspirin 81 mg daily.  3. Tenormin 75 mg daily.  4. Ogen 0.625 mg p.o. daily.  5. Dilantin 100 mg in the morning, 25 mg at 2 in the afternoon, 100 mg      nightly.  6. Tegretol 200 mg p.o. q.i.d.   ALLERGIES:  NO KNOWN DRUG ALLERGIES.   PAST MEDICAL HISTORY:  1. Status post right vocal cord medialization and laryngoplasty      secondary to right vocal cord paralysis.  2. Meningioma status post resection 1990.  3. Status post thyroidectomy.  4. Left wrist fracture suffered in June 2004.  5. Status post cholecystectomy complicated with  pancreatitis March      1999.  6. Hypertension.  7. Mitral valve prolapse requiring bacterial endocarditis prophylaxis.   FAMILY HISTORY:  Patient has a brother who died with lung cancer.   SOCIAL HISTORY:  Patient is a widow.  [place]  She presently lives independently.  She does not smoke, nor  does she drink alcohol.   LABORATORY DATA:  Hemoglobin in the emergency room is confirmed to be  low at 8.2, white count normal, platelet count normal, MCV 96.  Sodium,  potassium, chloride, bicarb normal.  BUN, the registered was 36  yesterday, is reported as 15 in the ER today.  Creatinine 0.6. Serum  glucose is elevated at 128, calcium 8.2. AST, ALT, alkaline phosphatase  and total bilirubin are normal.  Total protein is low at 5.6, albumin  low at 3.4.  Coags are normal.   Chest x-ray reveals no acute disease.   A 12-lead EKG reveals normal sinus rhythm at 71 beats per minute with no  acute ST or T-wave change.   PHYSICAL EXAMINATION:  GENERAL APPEARANCE:  A  well-developed, well-  nourished female in no acute respiratory distress.  VITAL SIGNS:  Temperature 97, blood pressure 138/63, heart rate 74,  respiratory rate 20, O2 saturation 100% on room air.  HEENT:  The patient has a right frontoparietal area cranium defect  consistent with her previous craniotomy for meningioma.  Pupils are  equal, round, reactive to light and accommodation.  Extraocular muscles  are intact bilaterally.  Patient's breath is consistent with that of a  GI bleeder.  There are multiple missing teeth but no evidence of active  abscess.  NECK:  No JVD, no lymphadenopathy, no thyromegaly.  LUNGS:  Clear to auscultation bilaterally without wheezes or rhonchi.  CARDIOVASCULAR:  Regular rate and rhythm with 2/6 holosystolic murmur  without gallop or rub.  ABDOMEN:  Soft, bowel sounds present, there is no hepatosplenomegaly, no  rebound, no ascites.  Bowel sounds are present but are hypoactive.  There is no appreciable mass.  EXTREMITIES:  No significant clubbing, cyanosis, or edema bilateral  lower extremities.  NEUROLOGIC:  There is 5/5 strength bilateral upper and lower  extremities.  Intact sensation to touch throughout.  Cranial nerves II-  XII intact bilaterally.  No Babinski.   IMPRESSION AND PLAN:  1. Acute blood loss anemia - Patient is suffering with symptomatic      anemia.  Fortunately, she is not hypotensive at present but she is      orthostatic by symptoms.  I will type, cross and transfuse the      patient for two units of packed red blood cells as I have every      reason to believe that her blood loss continues and that she will      continue to drop well below 8.  We will hold any potential platelet-      inhibiting medications or other types of blood thinners.  We will      follow her CBC on a q.8h. basis and transfuse further as needed. 2. Gastrointestinal bleed - The exact source of the patient's bleeding      is not clear.  Her symptoms are most  consistent with an upper      gastrointestinal bleed.  I will maintain the patient on a clear      liquid diet only.  I will consult GI as the patient will clearly  need an esophagogastroduodenoscopy for further evaluation once she      has become more stable.  3. Seizure disorder - Patient has a known seizure disorder being      status post craniotomy in 1990 for meningioma.  She reports that      she has not had a seizure in over one year.  I will check a      dilantin level.  Otherwise, I will continue dilantin and Tegretol      as dosed by Dr. Sandria Manly, her neurologist.  4. Hypertension - Patient's blood pressure is currently well      controlled. We will make no adjustments in her medications but I      will continue her Tenormin at the present time.  We will simply      follow her blood pressures      closely.  5. Mitral valve prolapse - Patient has known mitral valve prolapse.      She does take prophylaxis prior to procedures.  Should she undergo      an endoscopic procedure, she will clearly need that during her      hospital stay.      Lonia Blood, M.D.  Electronically Signed     JTM/MEDQ  D:  06/25/2006  T:  06/25/2006  Job:  [id number]   cc:   Erskine Speed, M.D.

## 2010-12-28 NOTE — Op Note (Signed)
NAME:  Hailey Faulkner, Hailey Faulkner                      ACCOUNT NO.:  [id number]   MEDICAL RECORD NO.:  [id number]                   PATIENT TYPE:  AMB   LOCATION:  DSC                                  FACILITY:  [facility]   PHYSICIAN:  Jefry H. Pollyann Kennedy, M.D.                DATE OF BIRTH:  05-13-1930   DATE OF PROCEDURE:  04/26/2002  DATE OF DISCHARGE:                                 OPERATIVE REPORT   PREOPERATIVE DIAGNOSIS:  Right vocal cord paralysis.   POSTOPERATIVE DIAGNOSIS:  Right vocal cord paralysis.   PROCEDURE:  Right vocal cord medialization and laryngoplasty.   SURGEON:  Jefry H. Pollyann Kennedy, M.D.   ANESTHESIA:  Local anesthesia was used.   COMPLICATIONS:  None.   FINDINGS:  Complete paralysis of the right vocal cord in the lateralized  position.  Preop voice was severely breathy and weak with extreme fatigue  and very minimal vocal strength.  Postop voice was significantly stronger  with better projection and a good, strong cough as well.   DISPOSITION:  The patient tolerated the procedure, was transferred to  recovery in stable condition.   HISTORY:  This is a lady with a history of an idiopathic right vocal cord  paralysis several months back that has been observed, and there has been no  significant change in her vocal abilities.  She has had minor problems with  dysphagia and aspiration but has not had pneumonia.  The risks, benefits,  alternatives, and complications of the procedure were explained to the  patient and her husband, who seemed to understand and agreed to surgery.   DESCRIPTION OF PROCEDURE:  The patient was taken to the operating room and  placed on the operating table in a supine position.  Following mild  intravenous sedation, the nasal cavities were sprayed with Afrin and  Xylocaine mix.  The fiberoptic laryngoscope was passed through the right  nasal cavity and was set up with a camera and video stand on a Mayo stand.  The larynx was viewed during the  procedure intermittently as needed.  The  neck was prepped and draped in a standard fashion.  Xylocaine 1% with  epinephrine was infiltrated into the anterior neck just to the right of  midline around the level of the midthyroid cartilage.  A 15 scalpel was used  to incise the skin and subcutaneous tissue.  The strap muscles were  reflected laterally to expose the thyroid cartilage.  The periosteum was  incised down the middle and carefully elevated laterally using Market researcher.  A cartilaginous window was outlined with electrocautery  approximately 0.5 cm to the right of midline and approximately 3-4 mm above  the inferior aspect of the thyroid ala.  The length of the window was  approximately 1.5 cm, and the width was just under 1 cm.  A 15 scalpel was  used to incise the window and remove the cartilage completely.  A Woodson  elevator was used to elevate the inner perichondrium off the inner service  of the thyroid lamina.  Using a combination of observation through the video  endoscope and having the patient phonate, different areas within the  paraglottic space were palpated to assess the best place for medialization.  Based on this, a Silastic block was cut into a wedge shape with mainly  posterior medialization that was approximately 3-4 mm offset superiorly from  the cartilaginous window.  This was inserted without difficulty and provided  excellent vocal strength.  An attempt was made to place the implant in, but  there was no ossification of the thyroid lamina and the screws would not  hold.  A 4-0 nylon suture was then used instead to try to secure the implant  in place, and two small shins of Silastic were used again to support it and  hold it in place, which worked very well.  The wound was irrigated with  saline.  Hemostasis was achieved, and the wound was closed in layers using 4-  0 chromic on the platysma and subcutaneous layer and interrupted 5-0 nylon  on the skin.  A  rubber band drain was left in the wound and secured in place  with a nylon suture.  A dressing was applied.  The patient was then  transferred to recovery in stable condition.                                               Jefry H. Pollyann Kennedy, M.D.    JHR/MEDQ  D:  04/27/2002  T:  04/27/2002  Job:  [id number]

## 2010-12-28 NOTE — Discharge Summary (Signed)
NAMEMarland Kitchen  Hailey Faulkner, Hailey Faulkner NO.:  [id number]   MEDICAL RECORD NO.:  [id number]          PATIENT TYPE:  INP   LOCATION:  5737                         FACILITY:  [facility]   PHYSICIAN:  Beckey Rutter, MD  DATE OF BIRTH:  04-22-1930   DATE OF ADMISSION:  06/25/2006  DATE OF DISCHARGE:  07/14/2006                               DISCHARGE SUMMARY   CHIEF COMPLAINT ON ADMISSION:  1. Acute blood loss anemia.  2. Generalized weakness.   BRIEF HISTORY OF PRESENT ILLNESS:  Hailey Faulkner is a pleasant 77-year-  old Caucasian female with multiple medical problems came because of  generalized weakness, and she was found to have severe anemia.   HOSPITAL COURSE:  During hospital course, the patient was seen by  gastroenterology service.  A consultation was done by Dr. Gabrielle Dare.  Hailey Faulkner, and Gastroenterology Dr. Barbette Hair. Hailey Faulkner.  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  The patient was operated after the endoscopy and colonoscopy by Dr.  Violeta Faulkner, as mentioned above, and the patient remained on TNA  until she recovered.  The pathology was affirmative disease diagnosis of  GIST tumor.   DISCHARGE DIAGNOSES:  1. 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  2. Hypothyroidism.  3. Meningioma status post resection in 1990.  4. Status post right vocal cord visualization and laryngoplasty      secondary to right vocal cord paralysis.  5. Status post thyroidectomy.  6. Hypertension, mild.  7. Mitral valve prolapse.   DISCHARGE MEDICATIONS:  1. Synthroid 125 mg daily.  2. Aspirin 81 mg daily.  3. Tenormin 75 mg daily.  4. Ogen 0.625 mg p.o. daily.  5. Dilantin 100 mg in the morning, 25 mg in the afternoon, and 100 mg      before bed.  6. Tegretol 200 mg p.o. q.i.d.   The patient now is off the TNA, and she is tolerating the oral intake  fine.  She is stable for discharge to follow up as outlined and  discussed with her.     Beckey Rutter, MD  Electronically Signed    EME/MEDQ  D:  10/11/2006  T:  10/12/2006  Job:  [id number]

## 2010-12-28 NOTE — Consult Note (Signed)
NAME:  CARAGH, GASPER NO.:  [id number]   MEDICAL RECORD NO.:  [id number]          PATIENT TYPE:  INP   LOCATION:  4710                         FACILITY:  [facility]   PHYSICIAN:  Gabrielle Dare. [name], M.D.DATE OF BIRTH:  1930-05-04   DATE OF CONSULTATION:  06/30/2006  DATE OF DISCHARGE:                                   CONSULTATION   CHIEF COMPLAINT:  Gastric tumor.   HISTORY OF PRESENT ILLNESS:  I was asked to evaluate this pleasant 76-year-  old white female by Dr. Christella Hartigan from [facility] in regards to a  gastric mass.  The patient complains of some recent tarry stools, feelings  of overall sweating and malaise.  She was noted to have a significant anemia  and was admitted to the [facility] for workup of a GI bleed.  Workup has included gastroenterology evaluation with upper and lower  endoscopy.  Upper endoscopy done by Dr. Claudette Head showed a possible mass  versus extrinsic compression. Colonoscopy was negative.  Biopsies done at  the initial upper endoscopy on June 27, 2006 came back consistent with a  gastrointestinal stromal tumor.  Subsequently, a CT scan of the abdomen and  pelvis was then done showing an 8 cm left upper quadrant mass, thought to be  consistent with a GIST tumor of the stomach wall.  Dr. Christella Hartigan from [facility]  gastroenterology performed endoscopic ultrasound today that shows this mass  to be submucosal in the gastric body and to be over 8 cm in size.  No  lymphadenopathy was noted on ultrasound.  Biopsies were again taken.   PAST MEDICAL HISTORY:  1. Meningioma.  2. Mitral valve prolapse.  3. Hypertension.   PAST SURGICAL HISTORY:  1. Resection of a meningioma in 1991.  2. She is also had laparoscopic cholecystectomy.  3. Hysterectomy.  4. Thyroidectomy.  5. Left elbow ORIF.  6. Right pharyngoplasty, with vocal cord medialization.   CURRENT MEDICATIONS:  Please see the MAR.   ALLERGIES:  NO KNOWN DRUG  ALLERGIES.   SOCIAL HISTORY:  The patient is a widow.  She lives alone.  She does not  smoke or drink alcohol.   REVIEW OF SYSTEMS:  A 15-system review was completed.  It was significant  for the gastroenterological complaints as outlined above, but otherwise  negative.   PHYSICAL EXAMINATION:  VITAL SIGNS:  Temperature 97, blood pressure 130/64,  heart rate 72, respirations 16.  GENERAL:  She is awake and alert.  She is in no acute distress.  NECK:  Supple, with no masses.  LUNGS:  Clear to auscultation.  Respiratory excursion is good.  HEART:  Regular.  No murmurs are heard.  Impulses palpable in the left  chest.  ABDOMEN:  Soft and nontender.  There is no distention.  Multiple scars are  present.  No masses are palpable.  EXTREMITIES:  Have no significant edema.  MUSCULOSKELETAL:  She ambulates without difficulty and has no focal weakness  noted.  SKIN:  Warm and dry, with no rashes.   DATA REVIEW:  White blood cell count 7.7, hemoglobin 10.4.   IMPRESSION  AND PLAN:  A 77 year old white female with an 8 cm gastric mass  consistent with a gastrointestinal stromal tumor.  I have offered partial  gastrectomy for removal of this mass.  I would recommend medical clearance  for surgery.  We would likely proceed with surgery this Wednesday, on  July 02, 2006.  The procedure was discussed with the patient and her  sister in some detail.  I have also offered to proceed electively should the  patient choose to schedule things and to come in as an outpatient. In either  case, she understands it will require approximately 1 week of postoperative  hospitalization.  Further risks and benefits were discussed.  She is leaning  towards having things taken care of while she is here, so I will discuss  this further with her primary team and the gastroenterology service.      Gabrielle Dare [name], M.D.  Electronically Signed     BET/MEDQ  D:  06/30/2006  T:  07/01/2006  Job:  [id number]    cc:   Rachael Fee, MD  [facility]

## 2010-12-28 NOTE — Op Note (Signed)
NAME:  Hailey Faulkner, Hailey Faulkner NO.:  [id number]   MEDICAL RECORD NO.:  [id number]          PATIENT TYPE:  INP   LOCATION:  5707                         FACILITY:  [facility]   PHYSICIAN:  Gabrielle Dare. [name], M.D.DATE OF BIRTH:  1930/07/18   DATE OF PROCEDURE:  07/02/2006  DATE OF DISCHARGE:                                 OPERATIVE REPORT   PREOPERATIVE DIAGNOSIS:  Gastrointestinal stromal tumor.   POSTOPERATIVE DIAGNOSIS:  Gastrointestinal stromal tumor.   PROCEDURE:  1. Lysis of adhesions.  2. Partial gastrectomy with excision of gastrointestinal stromal tumor.   SURGEON:  Gabrielle Dare. [name], M.D.   ASSISTANT:  Ardeth Sportsman, M.D.   ANESTHESIA:  General.   HISTORY OF PRESENT ILLNESS:  The patient is a 76 year old female who was  admitted with upper GI bleed.  Endoscopy revealed a submucosal mass.  Subsequent endoscopic ultrasound was done by Dr. Christella Hartigan.  Biopsies  demonstrated GIST tumor and we are proceeding with partial gastrectomy with  resection.  On endoscopic ultrasound no lymphadenopathy was noted.   PROCEDURE IN DETAIL:  Informed consent was obtained.  The patient received  intravenous antibiotics, she was brought to the operating room, general  anesthesia was administered.  Her abdomen was prepped and draped in a  sterile fashion.  An upper midline incision was made, subcutaneous tissues  were dissected down revealing the anterior fascia.  This was divided along  the linea alba, and the peritoneal cavity was entered under direct vision  without difficulty.  The fascia was opened superiorly to the subxiphoid  region and inferiorly down to the periumbilical region, however, there were  some adhesions in the lower portion of her abdomen from previous surgery.  Some extensive adhesiolysis was then required to free up her omentum which  took approximately 30 minutes.  Bowel was protected throughout and we were  able to free up the omentum and mobilize the colon,  which was accomplished.  The lesser sac was entered along the greater curvature of the stomach and  the gastrocolic omentum was divided along the greater curve extending up  laterally.  Of note, there was a small amount of blood in the lesser sac,  and a small hematoma on the tumor likely from the previous biopsy.  The  lesser curve was mobilized up toward the spleen, and then we carefully took  down the short gastrics.  This mobilization was accomplished with the  LigaSure with excellent hemostasis.  We continued carefully dividing the  short gastrics, freeing up the spleen from the upper body of the stomach.  The tumor was easily palpable and it was within the posterior wall of the  stomach 3 to 4 cm distal to the gastroesophageal junction.  Once all the  short gastrics were freed up, the stomach was pulled down further into the  wound.  We then made a gastrotomy medial to the anterior edge of the tumor,  and the tumor was wedged out circumferentially from the stomach using GIA 75  staplers and leaving at least a 1 cm margin.  Excellent hemostasis was  obtained with the stapler technique.  The specimen was sent to pathology.  The resulting gastrotomy was then closed with two firings of TA-90 stapler.  The inferior and superior margins were sent for additional pathology.  The  NG tube was repositioned into the stomach, and our gastrotomy staple line  was oversewn with a running 2-0 silk suture and excellent hemostasis was  obtained.  The left upper quadrant was reinspected and there was no  bleeding.  The abdomen was copiously irrigated with saline.  The areas of  adhesiolysis down toward the pelvis were rechecked and those areas were dry  and further irrigation was done.  We checked the left upper quadrant one  more time and our staple line as well.  Hemostasis was present.  The  remainder of the irrigation fluid was evacuated.  Sponge, needle and  instrument counts were correct.  We then  proceeded with closure.  The fascia  was closed with both ends of the incision with a #1 looped PDS, and  subcutaneous sutures were irrigated, hemostasis was insured and the skin was  closed with staples.  Sponge, needle and instrument counts were again  correct.  The patient tolerated the procedure well without apparent  complications, was taken to the recovery room in stable condition.      Gabrielle Dare [name], M.D.  Electronically Signed     BET/MEDQ  D:  07/02/2006  T:  07/03/2006  Job:  [id number]   cc:   Rachael Fee, MD

## 2011-01-21 ENCOUNTER — Telehealth: Payer: Self-pay | Admitting: Cardiology

## 2011-01-21 NOTE — Telephone Encounter (Signed)
Needs a sooner consultation appointment than the one I scheduled for her. Please call back.

## 2011-01-23 ENCOUNTER — Encounter: Payer: Self-pay | Admitting: Cardiology

## 2011-01-28 ENCOUNTER — Ambulatory Visit (INDEPENDENT_AMBULATORY_CARE_PROVIDER_SITE_OTHER): Payer: Medicare Other | Admitting: Cardiology

## 2011-01-28 ENCOUNTER — Encounter: Payer: Self-pay | Admitting: Cardiology

## 2011-01-28 DIAGNOSIS — E878 Other disorders of electrolyte and fluid balance, not elsewhere classified: Secondary | ICD-10-CM

## 2011-01-28 DIAGNOSIS — E7800 Pure hypercholesterolemia, unspecified: Secondary | ICD-10-CM | POA: Insufficient documentation

## 2011-01-28 DIAGNOSIS — R6889 Other general symptoms and signs: Secondary | ICD-10-CM

## 2011-01-28 DIAGNOSIS — R0602 Shortness of breath: Secondary | ICD-10-CM

## 2011-01-28 DIAGNOSIS — R0609 Other forms of dyspnea: Secondary | ICD-10-CM

## 2011-01-28 DIAGNOSIS — I10 Essential (primary) hypertension: Secondary | ICD-10-CM

## 2011-01-28 DIAGNOSIS — R0600 Dyspnea, unspecified: Secondary | ICD-10-CM | POA: Insufficient documentation

## 2011-01-28 DIAGNOSIS — E785 Hyperlipidemia, unspecified: Secondary | ICD-10-CM

## 2011-01-28 NOTE — Patient Instructions (Addendum)
We will schedule you for a Lexiscan Myoview study ( a stress test) to evaluate your coronary arteries and the pumping function of your heart.  Continue your same medications.Lexiscan Stress Test Your caregiver has ordered a Lexiscan Stress Test with nuclear imaging. The purpose of this test is to evaluate the blood supply to your heart muscle. This procedure is referred to as a "Non-Invasive Stress Test." This is because other than having an IV started in your vein, nothing is inserted or "invades" your body. Cardiac stress tests are done to find areas of poor blood flow to the heart by determining the extent of coronary artery disease (CAD). Some patients exercise on a treadmill, which naturally increases the blood flow to your heart. However, almost half of the patients undergoing a treadmill stress test each year are unable to exercise adequately. This is due to various medical conditions. For these patients, a pharmacologic/chemical stress agent called Eugenie Birks is used on patients without a history of asthma. This medicine will mimic walking on a treadmill by temporarily increasing your coronary blood flow. The side effects of this medicine include:  Headache.   Dizziness.   Shortness of breath.   Flushing.   Chest pain/pressure.   Feeling sick to your stomach (nauseous).   Increased heart rate.   Abdominal discomfort.   Low blood pressure.  BEFORE THE PROCEDURE  Avoid all forms of caffeine 24 hours before your test. This includes coffee, tea (even decaffeinated brands), caffeinated sodas, chocolate, cocoa and certain pain medications that contain caffeine.   Do not eat anything 6 hours before the test. In hospitalized patients, this means nothing by mouth after midnight.   Patients with diabetes that are not hospitalized (outpatients) should talk to their caregiver about how much, if any, insulin or oral diabetic medications they should take the day of the test. You should also talk  about the type of light snack that you should eat the morning of the test.   Patients with diabetes that are hospitalized (inpatients) will follow the cardiologist's written instructions that will be given to you.   Outpatients should bring a snack. Use the hospital vending machines so that you may eat right after the stress phase.   Wear comfortable shoes. There is at least an hour wait before the stress phase of nuclear scanning is done. The total procedure time is approximately 3 hours.   The following medications should be stopped 24 hours before your stress test: Beta-blockers and nitroglycerine (patches or paste should be removed 4 hours before the test).   Women, tell your caregiver if there is any possibility that you may be pregnant or if you are currently breastfeeding.  PROCEDURE There are two separate nuclear images taken using a nuclear camera. These images require a small dose of a radiotracer called an isotope. Most diagnostic nuclear medicine procedures result in low radiation exposure. Allergic reactions to the isotope may include slight redness going up the arm and pain during the injection. However, these reactions are extremely rare and usually mild. Eugenie Birks is given very rapidly over 10-15 seconds in the vein (intravenously) followed by a nuclear isotope. The medication causes the arteries in your heart to widen (dilate), and the nuclear isotope lights up those arteries so that the nuclear images are clear. Together, this shows whether the coronary blood flow is normal or abnormal. During this stress phase, you will be connected to an EKG machine. Your blood pressure and oxygen levels will be monitored by the cardiologist  and cardiac nurse. This part usually lasts 5-10 minutes. For inpatients, the procedure is done in the patient's room. For outpatients, the procedure is done in the stress lab.  The "Resting Phase" is done before the Lexiscan injection and shows how your heart  functions at rest. The "Stress Phase" is done about 1 hour after the Lexiscan injection and determines how your heart functions under stress.  LEXISCAN STRESS TEST IS USEFUL FOR DETERMINING:  The adequacy of blood flow to your heart during increased levels of activity in order to clear you for discharge home.   The extent of coronary artery blockage caused by CAD.   Your prognosis if you have suffered a heart attack.   The effectiveness of cardiac procedures done, such as an angioplasty, which can increase the circulation in your coronary arteries.   Cause(s) of chest pain/pressure.  RESULTS Not all test results are available during your visit. If your test results are not back during the visit, make an appointment with your caregiver to find out the results. Do not assume everything is normal if you have not heard from your caregiver or the medical facility. It is important for you to follow up on all of your test results. Document Released: 12/15/2008  [facility] Patient Information 2011 [place], [place].

## 2011-01-28 NOTE — Progress Notes (Signed)
Fredricka Bonine Date of Birth: 08-Jan-1930   History of Present Illness: Hailey Faulkner is seen at the request of Dr. Chilton Si for evaluation of dyspnea and exercise intolerance. She is a pleasant 77 year old white female. She lives alone. She has a history of prior stroke. She reports she has had several falls over the past couple of years. Over the past 2 weeks she has noticed symptoms of increased dyspnea. She has increased weakness in her legs and finds it difficult just walking out to her mailbox now. Occasionally she has pain underneath her rib cage bilaterally but this is nonexertional. She states that her legs have always been large and she's had no significant pitting edema. She denies any orthopnea or PND. She has had no other chest pain. She does have a history of hypertension. She reports a history of mitral valve prolapse but her echocardiogram in 2002 and showed no evidence of mitral valve prolapse.  Current Outpatient Prescriptions on File Prior to Visit  Medication Sig Dispense Refill  . acetaminophen (TYLENOL) 325 MG tablet Take 650 mg by mouth every 6 (six) hours as needed.        Marland Kitchen atenolol (TENORMIN) 100 MG tablet Take 100 mg by mouth daily.        . Calcium Carbonate-Vitamin D (CALCIUM + D PO) Take by mouth 2 (two) times daily.        . carbamazepine (TEGRETOL) 200 MG tablet Take 200 mg by mouth daily. 5 tablets daily       . doxazosin (CARDURA) 1 MG tablet Take 1 mg by mouth at bedtime.        Marland Kitchen esomeprazole (NEXIUM) 40 MG capsule Take 40 mg by mouth daily before breakfast.        . ibandronate (BONIVA) 150 MG tablet Take 150 mg by mouth every 30 (thirty) days. Take in the morning with a full glass of water, on an empty stomach, and do not take anything else by mouth or lie down for the next 30 min.       Marland Kitchen levothyroxine (SYNTHROID, LEVOTHROID) 125 MCG tablet Take 125 mcg by mouth daily.        Marland Kitchen losartan (COZAAR) 100 MG tablet Take 100 mg by mouth daily.        . meclizine  (ANTIVERT) 12.5 MG tablet Take 12.5 mg by mouth 3 (three) times daily as needed.        . phenytoin (DILANTIN) 100 MG ER capsule Take 100 mg by mouth 2 (two) times daily.        Marland Kitchen DILANTIN INFATABS 50 MG tablet Chew 0.5 tablets by mouth daily at 12 noon.      . Estropipate (OGEN 0.625 PO) Take by mouth daily.          Allergies  Allergen Reactions  . Hctz (Hydrochlorothiazide)   . Hydrocodone     Past Medical History  Diagnosis Date  . History of UTI   . MVP (mitral valve prolapse)   . Pancreatitis   . HBP (high blood pressure)   . OP (osteoporosis)   . Vocal cord paralysis     RIGHT VOCAL CORD  . History of malignant gastrointestinal stromal tumor (GIST)   . Hypothyroidism   . Seizure disorder   . SIADH (syndrome of inappropriate ADH production)   . Melanoma     vulvar  . Meningioma   . Gait instability   . CVA (cerebral infarction)     Past Surgical History  Procedure Date  . Thyroidectomy, partial 1970  . Cholecystectomy 10/2007  . Gastrectomy 2007  . Laryngoplasty     History  Smoking status  . Never Smoker   Smokeless tobacco  . Not on file    History  Alcohol Use No    Family History  Problem Relation Age of Onset  . Lung cancer Brother   . Heart disease Brother   . Heart attack Brother     Review of Systems: The review of systems is positive for exercise intolerance and dyspnea on exertion. She has chronic leg weakness. She was hospitalized in September of 2010 with an apparent stroke. Echocardiogram at that time demonstrated normal left ventricular function with mild LVH. All other systems were reviewed and are negative.  Physical Exam: BP 152/86  Pulse 60  Ht 5' 5.5" (1.664 m)  Wt 174 lb (78.926 kg)  BMI 28.51 kg/m2 She is a pleasant elderly white female who walks with a walker. She is normocephalic, atraumatic. Her pupils are equal round and reactive. Sclera clear. Oropharynx is clear. Neck is supple no JVD, adenopathy, thyromegaly, or  bruits. She has an old partial thyroidectomy scar. Lungs are clear. Cardiac exam reveals a regular rate and rhythm with a soft 1/6 systolic ejection murmur. Abdomen is obese, soft, nontender. She has no masses. Extremities are without edema. Pedal pulses are palpable. Skin is warm and dry. She is alert and oriented x3. Cranial nerves II through XII are intact. LABORATORY DATA: ECG dated January 17, 2011 shows normal sinus rhythm with left axis deviation. It is otherwise normal. Hemoglobin was 11.5. Sodium 134. Other chemistries were normal. Thyroid studies were normal. Cholesterol 219, triglycerides 136, HDL 77, LDL 115.  Assessment / Plan:

## 2011-01-28 NOTE — Assessment & Plan Note (Signed)
Dyspnea associated with exercise intolerance. Need to rule out anginal equivalent symptoms. Left ventricular function has been normal in the past. She does not have mitral valve allows by echocardiogram. There is no significant evidence of volume overload by exam. Cardiac risk factors include hypertension and mild hypercholesterolemia. We will schedule her for a Lexiscan Myoview study to further stratify her cardiac risk.

## 2011-01-29 ENCOUNTER — Encounter: Payer: Self-pay | Admitting: *Deleted

## 2011-02-05 ENCOUNTER — Ambulatory Visit (HOSPITAL_COMMUNITY): Payer: Medicare Other | Attending: Cardiology | Admitting: Radiology

## 2011-02-05 DIAGNOSIS — R0602 Shortness of breath: Secondary | ICD-10-CM | POA: Insufficient documentation

## 2011-02-05 DIAGNOSIS — R0609 Other forms of dyspnea: Secondary | ICD-10-CM

## 2011-02-05 DIAGNOSIS — I10 Essential (primary) hypertension: Secondary | ICD-10-CM | POA: Insufficient documentation

## 2011-02-05 DIAGNOSIS — E785 Hyperlipidemia, unspecified: Secondary | ICD-10-CM | POA: Insufficient documentation

## 2011-02-05 DIAGNOSIS — R6889 Other general symptoms and signs: Secondary | ICD-10-CM

## 2011-02-05 DIAGNOSIS — R079 Chest pain, unspecified: Secondary | ICD-10-CM

## 2011-02-05 MED ORDER — TECHNETIUM TC 99M TETROFOSMIN IV KIT
33.0000 | PACK | Freq: Once | INTRAVENOUS | Status: AC | PRN
  Administered 2011-02-05: 33 via INTRAVENOUS

## 2011-02-05 MED ORDER — TECHNETIUM TC 99M TETROFOSMIN IV KIT
11.0000 | PACK | Freq: Once | INTRAVENOUS | Status: AC | PRN
  Administered 2011-02-05: 11 via INTRAVENOUS

## 2011-02-05 MED ORDER — REGADENOSON 0.4 MG/5ML IV SOLN
0.4000 mg | Freq: Once | INTRAVENOUS | Status: AC
  Administered 2011-02-05: 0.4 mg via INTRAVENOUS

## 2011-02-05 NOTE — Progress Notes (Signed)
[facility] SITE 3 NUCLEAR MED [address] [place] [address] [phone]  Cardiology Nuclear Med Study  Hailey Faulkner is a 77 y.o. female [id number] [birth year]   Nuclear Med Background Indication for Stress Test:  Evaluation for Ischemia History: 2010 Echo; EF 60-65% mild LVH Cardiac Risk Factors: CVA, Family History - CAD, Hypertension and Lipids  Symptoms:  Chest Pain, DOE and SOB   Nuclear Pre-Procedure Caffeine/Decaff Intake:  None NPO After: 9:00pm   Lungs:  clear IV 0.9% NS with Angio Cath:  20g  IV Site: R Antecubital  IV Started by:  Stanton Kidney, EMT-P  Chest Size (in):  36 Cup Size: A  Height: 5\' 5"  (1.651 m)  Weight:  172 lb (78.019 kg)  BMI:  Body mass index is 28.62 kg/(m^2). Tech Comments:  Atenolol held this am, per patient.    Nuclear Med Study 1 or 2 day study: 1 day  Stress Test Type:  Eugenie Birks  Reading MD: Kristeen Miss, MD  Order Authorizing Provider:  P.Jordan  Resting Radionuclide: Technetium 28m Tetrofosmin  Resting Radionuclide Dose: 11.0 mCi   Stress Radionuclide:  Technetium 50m Tetrofosmin  Stress Radionuclide Dose: 33.0 mCi           Stress Protocol Rest HR: 66 Stress HR: 101  Rest BP: 156/72 Stress BP: 181/70  Exercise Time (min): n/a METS: n/a   Predicted Max HR: 141 bpm % Max HR: 71.63 bpm Rate Pressure Product: 84696   Dose of Adenosine (mg):  n/a Dose of Lexiscan: 0.4 mg  Dose of Atropine (mg): n/a Dose of Dobutamine: n/a mcg/kg/min (at max HR)  Stress Test Technologist: Milana Na, EMT-P  Nuclear Technologist:  Domenic Polite, CNMT     Rest Procedure:  Myocardial perfusion imaging was performed at rest 45 minutes following the intravenous administration of Technetium 42m Tetrofosmin. Rest ECG: NSR  Stress Procedure:  The patient received IV Lexiscan 0.4 mg over 15-seconds.  Technetium 9m Tetrofosmin injected at 30-seconds.  There were no significant changes and rare pvcs with Lexiscan.   Quantitative spect images were obtained after a 45 minute delay. Stress ECG: No significant change from baseline ECG  QPS Raw Data Images:  Normal; no motion artifact; normal heart/lung ratio. Stress Images:  Normal homogeneous uptake in all areas of the myocardium. Rest Images:  Normal homogeneous uptake in all areas of the myocardium. Subtraction (SDS):  No evidence of ischemia. Transient Ischemic Dilatation (Normal <1.22):  0.95 Lung/Heart Ratio (Normal <0.45):  0.24  Quantitative Gated Spect Images QGS EDV:  54 ml QGS ESV:  10 ml QGS cine images:  NL LV Function; NL Wall Motion QGS EF: 82%  Impression Exercise Capacity:  Lexiscan with no exercise. BP Response:  Normal blood pressure response. Clinical Symptoms:  No chest pain. ECG Impression:  No significant ST segment change suggestive of ischemia. Comparison with Prior Nuclear Study: No images to compare  Overall Impression:  Normal stress nuclear study.  No evidence of ischemia. Normal LV function.    Elyn Aquas., MD, [facility]

## 2011-02-06 NOTE — Progress Notes (Signed)
Nuc med study  Routed to Dr.Jordan 02/06/11 Hailey Faulkner

## 2011-02-06 NOTE — Progress Notes (Signed)
Normal nuclear stress test with normal EF. I would recommend an Echocardiogram to r/o pulmonary HTN.  Cc> primary MD

## 2011-02-07 NOTE — Progress Notes (Signed)
Spoke w/ niece;Suzanne at [phone]; notified her of stress test results. Will schedule Echo to r/o pulmonary HTN; 7/10 at 1:00

## 2011-02-07 NOTE — Progress Notes (Signed)
Addended by: Murrell Redden E on: 02/07/2011 11:38 AM   Modules accepted: Orders

## 2011-02-15 ENCOUNTER — Other Ambulatory Visit (HOSPITAL_COMMUNITY): Payer: Medicare Other | Admitting: Radiology

## 2011-02-18 ENCOUNTER — Ambulatory Visit: Payer: Medicare Other | Admitting: Cardiology

## 2011-02-19 ENCOUNTER — Ambulatory Visit (HOSPITAL_COMMUNITY): Payer: Medicare Other | Attending: Cardiology | Admitting: Radiology

## 2011-02-19 DIAGNOSIS — I059 Rheumatic mitral valve disease, unspecified: Secondary | ICD-10-CM | POA: Insufficient documentation

## 2011-02-19 DIAGNOSIS — R0602 Shortness of breath: Secondary | ICD-10-CM

## 2011-02-19 DIAGNOSIS — R0989 Other specified symptoms and signs involving the circulatory and respiratory systems: Secondary | ICD-10-CM

## 2011-02-19 DIAGNOSIS — E785 Hyperlipidemia, unspecified: Secondary | ICD-10-CM | POA: Insufficient documentation

## 2011-02-19 DIAGNOSIS — I079 Rheumatic tricuspid valve disease, unspecified: Secondary | ICD-10-CM | POA: Insufficient documentation

## 2011-02-19 DIAGNOSIS — I10 Essential (primary) hypertension: Secondary | ICD-10-CM | POA: Insufficient documentation

## 2011-02-26 ENCOUNTER — Telehealth: Payer: Self-pay | Admitting: *Deleted

## 2011-02-26 NOTE — Telephone Encounter (Signed)
Notified of Echo results. Made her an app for 7/31 to discuss results and see how she is doing.

## 2011-03-12 ENCOUNTER — Encounter: Payer: Self-pay | Admitting: Cardiology

## 2011-03-12 ENCOUNTER — Ambulatory Visit (INDEPENDENT_AMBULATORY_CARE_PROVIDER_SITE_OTHER): Payer: Medicare Other | Admitting: Cardiology

## 2011-03-12 VITALS — BP 170/90 | HR 68 | Ht 65.0 in | Wt 169.8 lb

## 2011-03-12 DIAGNOSIS — R0989 Other specified symptoms and signs involving the circulatory and respiratory systems: Secondary | ICD-10-CM

## 2011-03-12 DIAGNOSIS — I10 Essential (primary) hypertension: Secondary | ICD-10-CM

## 2011-03-12 DIAGNOSIS — R0602 Shortness of breath: Secondary | ICD-10-CM

## 2011-03-12 DIAGNOSIS — R0600 Dyspnea, unspecified: Secondary | ICD-10-CM

## 2011-03-12 MED ORDER — CARVEDILOL 25 MG PO TABS: 25.0000 mg | ORAL_TABLET | Freq: Two times a day (BID) | ORAL | Status: DC

## 2011-03-12 NOTE — Patient Instructions (Addendum)
We will schedule you for a CT scan of the chest with contrast.  Stop taking atenolol.  Start carvedilol 25 mg twice a day instead for blood pressure.

## 2011-03-12 NOTE — Progress Notes (Signed)
Hailey Faulkner Date of Birth: 22-Apr-1930   History of Present Illness: Hailey Faulkner is seen today for followup of her symptoms of dyspnea and exercise intolerance. Since her last visit she states she has noticed an improvement in her breathing. She is now able to walk down to her mailbox and back without stopping her getting significantly short of breath. She denies any cough or increase in edema. She has had no significant chest pain.  Current Outpatient Prescriptions on File Prior to Visit  Medication Sig Dispense Refill  . acetaminophen (TYLENOL) 325 MG tablet Take 650 mg by mouth every 6 (six) hours as needed.        . Calcium Carbonate-Vitamin D (CALCIUM + D PO) Take by mouth 2 (two) times daily.        . carbamazepine (TEGRETOL) 200 MG tablet Take 200 mg by mouth daily. 5 tablets daily       . DILANTIN INFATABS 50 MG tablet Chew 0.5 tablets by mouth daily at 12 noon.      Marland Kitchen doxazosin (CARDURA) 1 MG tablet Take 1 mg by mouth at bedtime.        Marland Kitchen esomeprazole (NEXIUM) 40 MG capsule Take 40 mg by mouth daily before breakfast.        . levothyroxine (SYNTHROID, LEVOTHROID) 125 MCG tablet Take 125 mcg by mouth daily.        Marland Kitchen losartan (COZAAR) 100 MG tablet Take 100 mg by mouth daily.        . meclizine (ANTIVERT) 12.5 MG tablet Take 12.5 mg by mouth 3 (three) times daily as needed.          Allergies  Allergen Reactions  . Hctz (Hydrochlorothiazide)   . Hydrocodone     Past Medical History  Diagnosis Date  . History of UTI   . Pancreatitis   . HBP (high blood pressure)   . OP (osteoporosis)   . Vocal cord paralysis     RIGHT VOCAL CORD  . History of malignant gastrointestinal stromal tumor (GIST)   . Hypothyroidism   . Seizure disorder   . SIADH (syndrome of inappropriate ADH production)   . Melanoma     vulvar  . Meningioma   . Gait instability   . CVA (cerebral infarction)     Past Surgical History  Procedure Date  . Thyroidectomy, partial 1970  .  Cholecystectomy 10/2007  . Gastrectomy 2007  . Laryngoplasty     History  Smoking status  . Never Smoker   Smokeless tobacco  . Not on file    History  Alcohol Use No    Family History  Problem Relation Age of Onset  . Lung cancer Brother   . Heart disease Brother   . Heart attack Brother     Review of Systems: The review of systems is positive for exercise intolerance and dyspnea on exertion. She has chronic leg weakness.  All other systems were reviewed and are negative.  Physical Exam: BP 170/90  Pulse 68  Ht 5\' 5"  (1.651 m)  Wt 169 lb 12.8 oz (77.021 kg)  BMI 28.26 kg/m2 She is a pleasant elderly white female who walks with a walker. She is normocephalic, atraumatic. Her pupils are equal round and reactive. Sclera clear. Oropharynx is clear. Neck is supple no JVD, adenopathy, thyromegaly, or bruits. She has an old partial thyroidectomy scar. Lungs are clear. Cardiac exam reveals a regular rate and rhythm with a soft 1/6 systolic ejection murmur. Abdomen is  obese, soft, nontender. She has no masses. Extremities are without edema. Pedal pulses are palpable. Skin is warm and dry. She is alert and oriented x3. Cranial nerves II through XII are intact. LABORATORY DATA: Her Lexiscan Myoview study demonstrated normal perfusion with an ejection fraction of 82%. Her recent echocardiogram showed normal left ventricular function. She had mild mitral insufficiency. There was moderate tricuspid insufficiency with evidence of moderate or mild hypertension. Her estimated PA pressure was 50 mm of mercury.  Assessment / Plan:

## 2011-03-12 NOTE — Assessment & Plan Note (Signed)
Her cardiac evaluation is remarkable for moderate pulmonary hypertension. She has normal left ventricular function. She has no significant evidence of congestive heart failure. She has normal perfusion. I've recommended a CT angiogram of the chest to rule out pulmonary embolic disease. She does have a history of vocal cord dysfunction and reports that she has had pulmonary evaluation in the past but I do not have any of these records. It may be worthwhile considering pulmonary function studies if her CT scan is unremarkable. Fortunately her symptoms have improved.

## 2011-03-12 NOTE — Assessment & Plan Note (Signed)
Her blood pressure is significantly elevated today and was elevated on her previous visit. I recommended changing her atenolol to carvedilol 25 mg b.i.d. Since this is more effective blood pressure control. If this does not significantly lower her blood pressure into range additional therapy may be required.

## 2011-03-15 ENCOUNTER — Ambulatory Visit
Admission: RE | Admit: 2011-03-15 | Discharge: 2011-03-15 | Disposition: A | Discharge status: Home / Self Care | Payer: Medicare Other | Source: Ambulatory Visit | Attending: Cardiology | Admitting: Cardiology

## 2011-03-15 ENCOUNTER — Other Ambulatory Visit: Payer: Medicare Other

## 2011-03-15 DIAGNOSIS — R0602 Shortness of breath: Secondary | ICD-10-CM

## 2011-03-15 MED ORDER — IOHEXOL 300 MG/ML  SOLN
125.0000 mL | Freq: Once | INTRAMUSCULAR | Status: AC | PRN
  Administered 2011-03-15: 125 mL via INTRAVENOUS

## 2011-03-19 ENCOUNTER — Telehealth: Payer: Self-pay | Admitting: *Deleted

## 2011-03-19 NOTE — Telephone Encounter (Signed)
Notified of CT of chest results. Will send copy to Dr. Elmore Guise to evaluate thyroid nodule

## 2011-03-19 NOTE — Telephone Encounter (Signed)
Message copied by Lorayne Bender on Tue Mar 19, 2011 10:45 AM ------      Message from: [place], PETER M      Created: Mon Mar 18, 2011 12:34 PM       CT of chest looks ok. No pulmonary emboli, no emphysema. Right thyroid nodule needs to be evaluated by Dr. Elmore Guise.

## 2011-03-20 ENCOUNTER — Encounter: Payer: Self-pay | Admitting: Cardiology

## 2011-06-27 ENCOUNTER — Other Ambulatory Visit: Payer: Self-pay | Admitting: Internal Medicine

## 2011-06-27 ENCOUNTER — Ambulatory Visit
Admission: RE | Admit: 2011-06-27 | Discharge: 2011-06-27 | Disposition: A | Discharge status: Home / Self Care | Payer: Medicare Other | Source: Ambulatory Visit | Attending: Internal Medicine | Admitting: Internal Medicine

## 2011-06-27 DIAGNOSIS — R05 Cough: Secondary | ICD-10-CM

## 2012-08-11 ENCOUNTER — Ambulatory Visit
Admission: RE | Admit: 2012-08-11 | Discharge: 2012-08-11 | Disposition: A | Discharge status: Home / Self Care | Payer: Medicare Other | Source: Ambulatory Visit | Attending: Internal Medicine | Admitting: Internal Medicine

## 2012-08-11 ENCOUNTER — Other Ambulatory Visit: Payer: Self-pay | Admitting: Internal Medicine

## 2012-08-11 DIAGNOSIS — R0789 Other chest pain: Secondary | ICD-10-CM

## 2012-08-11 DIAGNOSIS — R05 Cough: Secondary | ICD-10-CM

## 2012-12-03 ENCOUNTER — Encounter: Payer: Self-pay | Admitting: Gastroenterology

## 2013-06-23 ENCOUNTER — Telehealth: Payer: Self-pay | Admitting: Neurology

## 2013-06-23 NOTE — Telephone Encounter (Signed)
Sent to Joellen Jersey to reassign with new physician

## 2013-07-02 ENCOUNTER — Telehealth: Payer: Self-pay | Admitting: *Deleted

## 2013-07-02 NOTE — Telephone Encounter (Signed)
Called POA Rosalita Chessman and discussed her concerns. Patient unable to come today. Will schedule patient for follow up with me on Nov 25 at 3:30pm

## 2013-07-02 NOTE — Telephone Encounter (Signed)
This is former pt of Dr. Sandria Manly.   Pt has not been taking her seizure medications for how long??  Saw Dr. Chilton Si (pcp) labs done, showed no level.  Pt started back with taking her medication at her maintenance dosing since 2 wks ago.  She started after October vacation of having ? SE, confusion , memory loss.  POA questioning going back on meds full dose (not titration) causing problem?  No appt that she knew to f/u lab work.  Toxicity?  I called Dr. Thomasene Lot office for ofv note and lab results.  Offered appt today at 1130, she lives in [place] and is not able to make today.  Please advise.

## 2013-07-06 ENCOUNTER — Ambulatory Visit (INDEPENDENT_AMBULATORY_CARE_PROVIDER_SITE_OTHER): Payer: Medicare Other | Admitting: Neurology

## 2013-07-06 ENCOUNTER — Encounter: Payer: Self-pay | Admitting: Neurology

## 2013-07-06 VITALS — BP 154/82 | HR 79 | Ht 64.5 in | Wt 179.0 lb

## 2013-07-06 DIAGNOSIS — R569 Unspecified convulsions: Secondary | ICD-10-CM

## 2013-07-06 MED ORDER — LEVETIRACETAM ER 750 MG PO TB24: 750.0000 mg | ORAL_TABLET | Freq: Every day | ORAL | Status: DC

## 2013-07-06 NOTE — Patient Instructions (Addendum)
Overall you are doing fairly well but I do want to suggest a few things today:   Remember to drink plenty of fluid, eat healthy meals and do not skip any meals. Try to eat protein with a every meal and eat a healthy snack such as fruit or nuts in between meals. Try to keep a regular sleep-wake schedule and try to exercise daily, particularly in the form of walking, 20-30 minutes a day, if you can.   As far as your medications are concerned, I would like to suggest discontinuing the dilantin and tegretol. Instead we will have you take Keppra 750mg  XR once a day.Please overlap the keppra and dilantin for 1 week.   We will check some blood work today  I would like to see you back in 4 months, sooner if we need to. Please call us with any interim questions, concerns, problems, updates or refill requests.   Please also call us for any test results so we can go over those with you on the phone.  My clinical assistant and will answer any of your questions and relay your messages to me and also relay most of my messages to you.   Our phone number is [phone]. We also have an after hours call service for urgent matters and there is a physician on-call for urgent questions. For any emergencies you know to call 911 or go to the nearest emergency room

## 2013-07-06 NOTE — Progress Notes (Signed)
[id number]   Provider:  Dr Hosie Poisson Referring Provider: Enrique Sack, MD Primary Care Physician:  Enrique Sack, MD  CC:  seizures  HPI:  Hailey Faulkner is a 79 y.o. female here as a follow up with concerns of increased confusion, fatigue, medication non-compliance. Her last seizure was in 2001. Notes she has not been feeling well lately. She feels unsteady on her feet. Gets anxious and confused. Her niece has noticed that she has poor compliance with her medication. Her niece notices when she takes her medication like she is supposed to she is very fatigued, confused. They notice if she takes all 5 of the tegretol she is "out of it". Niece notices episodes where she will have brief pauses in her speech, lasting <2-3 seconds, these occuring a few times a day.   Patient goes off on tangents, difficulty to direct on task. Currently lives at home on her own.    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 Has a history of recurrent seizures, the last episode in April 2001. Seizures are generalized without aura. Has history of Dilantin toxicity and take an unusual schedule she is also on Tegretol. Has history of falls. In 2012 suffered a fall with a resultant left-sided subdural hematoma.  Concerns/Questions:Review of Systems: Out of a complete 14 system review, the patient complains of only the following symptoms, and all other reviewed systems are negative. Positive for weight gain memory loss confusion difficulty swallowing cough incontinence constipation trouble swallowing hearing loss  History   Social History  . Marital Status: Widowed    Spouse Name: N/A    Number of Children: 0  . Years of Education: N/A   Occupational History  . telephone operator     retired   Social History Main Topics  . Smoking status: Never Smoker   . Smokeless tobacco: Not  on file  . Alcohol Use: No  . Drug Use: No  . Sexual Activity:    Other Topics Concern  . Not on file   Social History Narrative  . No narrative on file    Family History  Problem Relation Age of Onset  . Lung cancer Brother   . Heart disease Brother   . Heart attack Brother     Past Medical History  Diagnosis Date  . History of UTI   . Pancreatitis   . HBP (high blood pressure)   . OP (osteoporosis)   . Vocal cord paralysis     RIGHT VOCAL CORD  . History of malignant gastrointestinal stromal tumor (GIST)   . Hypothyroidism   . Seizure disorder   . SIADH (syndrome of inappropriate ADH production)   . Melanoma     vulvar  . Meningioma   . Gait instability   . CVA (cerebral infarction)     Past Surgical History  Procedure Laterality Date  . Thyroidectomy, partial  1970  . Cholecystectomy  10/2007  . Gastrectomy  2007  . Laryngoplasty      Current Outpatient Prescriptions  Medication Sig Dispense Refill  . acetaminophen (TYLENOL) 325 MG tablet Take 650 mg by mouth every 6 (six) hours as needed.        Marland Kitchen atenolol (TENORMIN) 100 MG tablet Take 100 mg by mouth daily.      . Calcium Carbonate-Vitamin D (CALCIUM + D PO) Take by mouth 2 (two) times daily.        Marland Kitchen  carbamazepine (TEGRETOL) 200 MG tablet Take 200 mg by mouth daily. 5 tablets daily       . Cyanocobalamin (VITAMIN B-12 IJ) Inject as directed every 30 (thirty) days.        Marland Kitchen DILANTIN INFATABS 50 MG tablet Chew 100 mg by mouth 2 (two) times daily.       Marland Kitchen doxazosin (CARDURA) 1 MG tablet Take 1 mg by mouth at bedtime.        Marland Kitchen esomeprazole (NEXIUM) 40 MG capsule Take 40 mg by mouth daily before breakfast.        . levothyroxine (SYNTHROID, LEVOTHROID) 125 MCG tablet Take 125 mcg by mouth daily.        Marland Kitchen losartan (COZAAR) 100 MG tablet Take 100 mg by mouth daily.        . meclizine (ANTIVERT) 12.5 MG tablet Take 12.5 mg by mouth 3 (three) times daily as needed.        . polyethylene glycol (MIRALAX /  GLYCOLAX) packet Take 17 g by mouth daily.       No current facility-administered medications for this visit.    Allergies as of 07/06/2013 - Review Complete 03/12/2011  Allergen Reaction Noted  . Hctz [hydrochlorothiazide]  01/28/2011  . Hydrocodone  01/28/2011    Vitals: There were no vitals taken for this visit. Last Weight:  Wt Readings from Last 1 Encounters:  03/12/11 169 lb 12.8 oz (77.021 kg)   Last Height:   Ht Readings from Last 1 Encounters:  03/12/11 5\' 5"  (1.651 m)     Physical exam: Exam: Gen: NAD, conversant Eyes: anicteric sclerae, moist conjunctivae HENT: Atraumatic, oropharynx clear Lungs: CTA, no wheezing, rales, rhonic                          CV: RRR, no MRG Abdomen: Soft, non-tender;  Extremities: No peripheral edema  Skin: Normal temperature, no rash,  Psych: Appropriate affect, pleasant  Neuro: MS: AA&Ox3, appropriately interactive, normal affect   Speech: fluent w/o paraphasic error, hoarse dysphonic speech  Memory: good recent and remote recall  CN: PERRL, EOMI with bilateral cataract surgery, no nystagmus, mild R ptosis, sensation intact to LT V1-V3 bilat, face symmetric, no weakness, hearing grossly intact, palate elevates symmetrically, shoulder shrug 5/5 bilat,  tongue protrudes midline, no fasiculations noted.  Motor: normal bulk and tone Strength: 5/5  In all extremities  Coord: rapid alternating and point-to-point (FNF, HTS) movements intact.  Reflexes: symmetrical, bilat downgoing toes  Sens: LT intact in all extremities  Gait: posture, stance, stride and arm-swing normal. .   Assessment:  After physical and neurologic examination, review of laboratory studies, imaging, neurophysiology testing and pre-existing records, assessment will be reviewed on the problem list.  Plan:  Treatment plan and additional workup will be reviewed under Problem List.  1)Seizures  79 year old woman with history of seizure disorder related  to prior meningioma with surgical resection presenting for followup visit. Her last visit was over 2 years ago. Her niece notes that she has poor compliance her medication, there is concern that she is toxic when she takes full dose of Tegretol and Dilantin. They've noticed increased confusion, instability in the past few weeks. Do to difficulty with compliance and complicated current regimen will switch patient to a one time dose of Keppra 750 mg extended release daily. May need to increase this in the future. Patient states was given extensive instructions on how to make the transition. Will check blood  levels today for Dilantin Tegretol CMP and CBC. Will check MOCA at next visit. Follow up in 4 months or earlier if needed.  A total of 45 minutes was spent in with this patient. Over half this time was spent on counseling patient on the diagnosis and different therapeutic options available. We extensively discussed her current medication regimen, usage and potential side effects of her current medications. All questions were fully answered.

## 2013-07-07 ENCOUNTER — Other Ambulatory Visit (INDEPENDENT_AMBULATORY_CARE_PROVIDER_SITE_OTHER): Payer: Self-pay

## 2013-07-07 DIAGNOSIS — R569 Unspecified convulsions: Secondary | ICD-10-CM

## 2013-07-07 DIAGNOSIS — Z0289 Encounter for other administrative examinations: Secondary | ICD-10-CM

## 2013-07-10 LAB — COMPREHENSIVE METABOLIC PANEL
ALT: 9 IU/L (ref 0–32)
Albumin: 4.6 g/dL (ref 3.5–4.7)
BUN: 12 mg/dL (ref 8–27)
Calcium: 8.8 mg/dL (ref 8.6–10.2)
Chloride: 96 mmol/L — ABNORMAL LOW (ref 97–108)
GFR calc Af Amer: 84 mL/min/{1.73_m2} (ref >59)
Glucose: 128 mg/dL — ABNORMAL HIGH (ref 65–99)
Potassium: 4.1 mmol/L (ref 3.5–5.2)
Total Bilirubin: 0.3 mg/dL (ref 0.0–1.2)
Total Protein: 6.5 g/dL (ref 6.0–8.5)

## 2013-07-10 LAB — CARBAMAZEPINE LEVEL, TOTAL: Carbamazepine Lvl: 5 ug/mL (ref 4.0–12.0)

## 2013-07-10 LAB — CBC
Hemoglobin: 12.3 g/dL (ref 11.1–15.9)
MCHC: 34.5 g/dL (ref 31.5–35.7)
Platelets: 191 10*3/uL (ref 150–379)
RBC: 3.81 x10E6/uL (ref 3.77–5.28)

## 2013-07-10 LAB — PHENYTOIN LEVEL, FREE AND TOTAL: Phenytoin, Free: 1.1 ug/mL (ref 1.0–2.0)

## 2013-07-22 ENCOUNTER — Telehealth: Payer: Self-pay | Admitting: Neurology

## 2013-07-22 NOTE — Telephone Encounter (Signed)
Patient's power of attorney called stating that patient came off of Dilantin completely on Sunday and starting on Monday, the following symptoms were noticed: patient was talking excessively and her appetite is off, and in the morning she wakes up and the back of her head feels funny. Please call.

## 2013-07-23 NOTE — Telephone Encounter (Signed)
I have left message for her.

## 2013-07-26 NOTE — Telephone Encounter (Signed)
Call returned. Will continue on Keppra for now and see if adverse effects resolve. Caregiver to call back in 2 weeks (or sooner) if symptoms do not resolve or worsen.

## 2013-07-29 ENCOUNTER — Telehealth: Payer: Self-pay | Admitting: Neurology

## 2013-07-29 NOTE — Telephone Encounter (Signed)
States patients side effects that were discussed with physician last week have gotten worst and would like to discuss

## 2013-10-06 ENCOUNTER — Encounter: Payer: Self-pay | Admitting: Neurology

## 2013-10-06 ENCOUNTER — Encounter (INDEPENDENT_AMBULATORY_CARE_PROVIDER_SITE_OTHER): Payer: Self-pay

## 2013-10-06 ENCOUNTER — Ambulatory Visit (INDEPENDENT_AMBULATORY_CARE_PROVIDER_SITE_OTHER): Payer: Medicare HMO | Admitting: Neurology

## 2013-10-06 VITALS — BP 182/89 | HR 61 | Ht 64.0 in | Wt 176.0 lb

## 2013-10-06 DIAGNOSIS — R569 Unspecified convulsions: Secondary | ICD-10-CM

## 2013-10-06 DIAGNOSIS — I10 Essential (primary) hypertension: Secondary | ICD-10-CM

## 2013-10-06 MED ORDER — LEVETIRACETAM ER 750 MG PO TB24: 750.0000 mg | ORAL_TABLET | Freq: Every day | ORAL | Status: DC

## 2013-10-06 NOTE — Patient Instructions (Addendum)
Overall you are doing fairly well but I do want to suggest a few things today:   Remember to drink plenty of fluid, eat healthy meals and do not skip any meals. Try to eat protein with a every meal and eat a healthy snack such as fruit or nuts in between meals. Try to keep a regular sleep-wake schedule and try to exercise daily, particularly in the form of walking, 20-30 minutes a day, if you can.   As far as your medications are concerned, I would like to suggest the following: 1)Continue taking Keppra 750mg  once daily   Please follow up with Dr Nyoka Cowden to discuss elevated blood pressure. You may need some adjustment in your medications.   I would like to see you back in 6 months, sooner if we need to. Please call us with any interim questions, concerns, problems, updates or refill requests.   My clinical assistant and will answer any of your questions and relay your messages to me and also relay most of my messages to you.   Our phone number is [phone]. We also have an after hours call service for urgent matters and there is a physician on-call for urgent questions. For any emergencies you know to call 911 or go to the nearest emergency room

## 2013-10-06 NOTE — Progress Notes (Signed)
[id number]   Provider:  Dr Janann Colonel Referring Provider: Criselda Peaches, MD Primary Care Physician:  Criselda Peaches, MD  CC:  seizures  HPI:  Hailey Faulkner is a 80 y.o. female here as a follow up for concerns of increased confusion, fatigue, medication non-compliance. Her last seizure was in 2001. Last visit was 06/2013 at which time she was discontinued off her Dilantin and Tegretol and switched to [place] 750mg  daily. Patient and family note marked improvement in cognition and overall function since making the switch to Keppra. She is more alert and appears to be back to her normal self. Tolerating keppra well, no breakthrough seizures, no gait instability.  She continues to live by herself, takes care of ADLs, pays her own bills.    Prior visit with Dr Erling Cruz 08/02/2011: 80y/o past medical history of right frontoparietal craniectomy for sphenoid ridge meningioma in October 1990. Has a history of recurrent seizures, the last episode in April 2001. Seizures are generalized without aura. Has history of Dilantin toxicity and take an unusual schedule she is also on Tegretol. Has history of falls. In 2012 suffered a fall with a resultant left-sided subdural hematoma.  Concerns/Questions:Review of Systems: Out of a complete 14 system review, the patient complains of only the following symptoms, and all other reviewed systems are negative. Positive for fatigue, urinary incontinence  History   Social History  . Marital Status: Widowed    Spouse Name: N/A    Number of Children: 0  . Years of Education: 12   Occupational History  . telephone operator     retired   Social History Main Topics  . Smoking status: Never Smoker   . Smokeless tobacco: Never Used  . Alcohol Use: No  . Drug Use: No  . Sexual Activity: Not on file   Other Topics Concern  . Not on file   Social History Narrative   Patient is widowed and lives alone.   Patient doesn't have any  children.   Patient is retired.   Patient has a high school education.   Patient is right-handed.   Patient drinks 2 cups of coffee and one glass of tea daily.    Family History  Problem Relation Age of Onset  . Lung cancer Brother   . Heart disease Brother   . Heart attack Brother     Past Medical History  Diagnosis Date  . History of UTI   . Pancreatitis   . HBP (high blood pressure)   . OP (osteoporosis)   . Vocal cord paralysis     RIGHT VOCAL CORD  . History of malignant gastrointestinal stromal tumor (GIST)   . Hypothyroidism   . Seizure disorder   . SIADH (syndrome of inappropriate ADH production)   . Melanoma     vulvar  . Meningioma   . Gait instability   . CVA (cerebral infarction)     Past Surgical History  Procedure Laterality Date  . Thyroidectomy, partial  1970  . Cholecystectomy  10/2007  . Gastrectomy  2007  . Laryngoplasty      Current Outpatient Prescriptions  Medication Sig Dispense Refill  . acetaminophen (TYLENOL) 325 MG tablet Take 650 mg by mouth every 6 (six) hours as needed.        Marland Kitchen atenolol (TENORMIN) 100 MG tablet Take 100 mg by mouth daily.      . Calcium Carbonate-Vitamin D (CALCIUM + D PO) Take by mouth 2 (two) times daily.        Marland Kitchen  Cyanocobalamin (VITAMIN B-12 IJ) Inject as directed every 30 (thirty) days.        Marland Kitchen doxazosin (CARDURA) 1 MG tablet Take 1 mg by mouth at bedtime.        Marland Kitchen esomeprazole (NEXIUM) 40 MG capsule Take 40 mg by mouth daily before breakfast.        . Levetiracetam 750 MG TB24 Take 1 tablet (750 mg total) by mouth daily.  90 tablet  3  . levothyroxine (SYNTHROID, LEVOTHROID) 125 MCG tablet Take 125 mcg by mouth daily.        Marland Kitchen losartan (COZAAR) 100 MG tablet Take 100 mg by mouth daily.        . meclizine (ANTIVERT) 12.5 MG tablet Take 12.5 mg by mouth 3 (three) times daily as needed.        . polyethylene glycol (MIRALAX / GLYCOLAX) packet Take 17 g by mouth daily.       No current facility-administered  medications for this visit.    Allergies as of 10/06/2013 - Review Complete 10/06/2013  Allergen Reaction Noted  . Hctz [hydrochlorothiazide]  01/28/2011  . Hydrocodone  01/28/2011    Vitals: BP 182/89  Pulse 61  Ht 5\' 4"  (1.626 m)  Wt 176 lb (79.833 kg)  BMI 30.20 kg/m2  Manually rechecked BP and was 162/86 Last Weight:  Wt Readings from Last 1 Encounters:  10/06/13 176 lb (79.833 kg)   Last Height:   Ht Readings from Last 1 Encounters:  10/06/13 5\' 4"  (1.626 m)     Physical exam: Exam: Gen: NAD, conversant Eyes: anicteric sclerae, moist conjunctivae HENT: Atraumatic, oropharynx clear Lungs: CTA, no wheezing, rales, rhonic                          CV: RRR, no MRG Abdomen: Soft, non-tender;  Extremities: No peripheral edema  Skin: Normal temperature, no rash,  Psych: Appropriate affect, pleasant  Neuro: MS: MOCA 23/30 Alert, responsive, pleasant, interacts well.   CN: PERRL, EOMI with bilateral cataract surgery, no nystagmus, mild R ptosis, sensation intact to LT V1-V3 bilat, face symmetric, no weakness, hearing grossly intact, palate elevates symmetrically, shoulder shrug 5/5 bilat,  tongue protrudes midline, no fasiculations noted.Dysphonic pressured speech  Motor: normal bulk and tone Strength: 5/5  In all extremities  Coord: rapid alternating and point-to-point (FNF, HTS) movements intact.  Reflexes: symmetrical, bilat downgoing toes  Sens: LT intact in all extremities  Gait: posture, stance, stride and arm-swing normal. .   Assessment:  After physical and neurologic examination, review of laboratory studies, imaging, neurophysiology testing and pre-existing records, assessment will be reviewed on the problem list.  Plan:  Treatment plan and additional workup will be reviewed under Problem List.  1)Seizures 54)HTN  80 year old woman with history of seizure disorder related to prior meningioma with surgical resection presenting for followup visit.  Last visit was in November at which time she was discontinued from her dilantin and tegretol and was started on keppra due to concerns this was causing AMS. Patient returns today with marked improvement in overall function. Will continue on keppra 750mg  ER daily. Based on discussion and exam findings patient does appear to have cognitive ability to make business decisions (is currently managing home finances). Patients BP found to be elevated today, she will follow up with Dr Nyoka Cowden next week for further management. Follow up in 6 months.    A total of 45 minutes was spent in with this patient. Over half  this time was spent on counseling patient on the diagnosis and different therapeutic options available.

## 2014-01-01 ENCOUNTER — Emergency Department (HOSPITAL_COMMUNITY): Payer: Medicare HMO

## 2014-01-01 ENCOUNTER — Inpatient Hospital Stay (HOSPITAL_COMMUNITY)
Admission: EM | Admit: 2014-01-01 | Discharge: 2014-01-06 | DRG: 064 | Disposition: A | Discharge status: Skilled Nursing Facility | Payer: Medicare HMO | Attending: Internal Medicine | Admitting: Internal Medicine

## 2014-01-01 ENCOUNTER — Encounter (HOSPITAL_COMMUNITY): Payer: Self-pay | Admitting: Emergency Medicine

## 2014-01-01 DIAGNOSIS — M810 Age-related osteoporosis without current pathological fracture: Secondary | ICD-10-CM | POA: Diagnosis present

## 2014-01-01 DIAGNOSIS — K2990 Gastroduodenitis, unspecified, without bleeding: Secondary | ICD-10-CM

## 2014-01-01 DIAGNOSIS — I639 Cerebral infarction, unspecified: Secondary | ICD-10-CM | POA: Diagnosis present

## 2014-01-01 DIAGNOSIS — Z87440 Personal history of urinary (tract) infections: Secondary | ICD-10-CM

## 2014-01-01 DIAGNOSIS — K2970 Gastritis, unspecified, without bleeding: Secondary | ICD-10-CM

## 2014-01-01 DIAGNOSIS — T17908A Unspecified foreign body in respiratory tract, part unspecified causing other injury, initial encounter: Secondary | ICD-10-CM | POA: Insufficient documentation

## 2014-01-01 DIAGNOSIS — R5383 Other fatigue: Secondary | ICD-10-CM

## 2014-01-01 DIAGNOSIS — G9340 Encephalopathy, unspecified: Secondary | ICD-10-CM

## 2014-01-01 DIAGNOSIS — E236 Other disorders of pituitary gland: Secondary | ICD-10-CM | POA: Diagnosis present

## 2014-01-01 DIAGNOSIS — D329 Benign neoplasm of meninges, unspecified: Secondary | ICD-10-CM

## 2014-01-01 DIAGNOSIS — Z66 Do not resuscitate: Secondary | ICD-10-CM | POA: Diagnosis present

## 2014-01-01 DIAGNOSIS — R110 Nausea: Secondary | ICD-10-CM

## 2014-01-01 DIAGNOSIS — E785 Hyperlipidemia, unspecified: Secondary | ICD-10-CM | POA: Diagnosis present

## 2014-01-01 DIAGNOSIS — R569 Unspecified convulsions: Secondary | ICD-10-CM

## 2014-01-01 DIAGNOSIS — R4789 Other speech disturbances: Secondary | ICD-10-CM

## 2014-01-01 DIAGNOSIS — R479 Unspecified speech disturbances: Secondary | ICD-10-CM | POA: Diagnosis present

## 2014-01-01 DIAGNOSIS — R0600 Dyspnea, unspecified: Secondary | ICD-10-CM

## 2014-01-01 DIAGNOSIS — C49A2 Gastrointestinal stromal tumor of stomach: Secondary | ICD-10-CM | POA: Diagnosis present

## 2014-01-01 DIAGNOSIS — I6330 Cerebral infarction due to thrombosis of unspecified cerebral artery: Principal | ICD-10-CM | POA: Diagnosis present

## 2014-01-01 DIAGNOSIS — R29898 Other symptoms and signs involving the musculoskeletal system: Secondary | ICD-10-CM | POA: Diagnosis present

## 2014-01-01 DIAGNOSIS — R471 Dysarthria and anarthria: Secondary | ICD-10-CM | POA: Diagnosis present

## 2014-01-01 DIAGNOSIS — R1310 Dysphagia, unspecified: Secondary | ICD-10-CM

## 2014-01-01 DIAGNOSIS — G40909 Epilepsy, unspecified, not intractable, without status epilepticus: Secondary | ICD-10-CM | POA: Diagnosis present

## 2014-01-01 DIAGNOSIS — I10 Essential (primary) hypertension: Secondary | ICD-10-CM

## 2014-01-01 DIAGNOSIS — E7800 Pure hypercholesterolemia, unspecified: Secondary | ICD-10-CM

## 2014-01-01 DIAGNOSIS — D131 Benign neoplasm of stomach: Secondary | ICD-10-CM | POA: Diagnosis present

## 2014-01-01 DIAGNOSIS — Z8673 Personal history of transient ischemic attack (TIA), and cerebral infarction without residual deficits: Secondary | ICD-10-CM

## 2014-01-01 DIAGNOSIS — J3800 Paralysis of vocal cords and larynx, unspecified: Secondary | ICD-10-CM

## 2014-01-01 DIAGNOSIS — J690 Pneumonitis due to inhalation of food and vomit: Secondary | ICD-10-CM

## 2014-01-01 LAB — URINALYSIS, ROUTINE W REFLEX MICROSCOPIC
Bilirubin Urine: NEGATIVE
GLUCOSE, UA: NEGATIVE mg/dL
HGB URINE DIPSTICK: NEGATIVE
Ketones, ur: NEGATIVE mg/dL
Leukocytes, UA: NEGATIVE
Nitrite: NEGATIVE
PH: 6 (ref 5.0–8.0)
PROTEIN: NEGATIVE mg/dL
Specific Gravity, Urine: 1.019 (ref 1.005–1.030)
Urobilinogen, UA: 0.2 mg/dL (ref 0.0–1.0)

## 2014-01-01 LAB — CBC WITH DIFFERENTIAL/PLATELET
BASOS ABS: 0 10*3/uL (ref 0.0–0.1)
BASOS PCT: 0 % (ref 0–1)
EOS PCT: 0 % (ref 0–5)
Eosinophils Absolute: 0 10*3/uL (ref 0.0–0.7)
HEMATOCRIT: 34.4 % — AB (ref 36.0–46.0)
Hemoglobin: 11.4 g/dL — ABNORMAL LOW (ref 12.0–15.0)
Lymphocytes Relative: 11 % — ABNORMAL LOW (ref 12–46)
Lymphs Abs: 1.5 10*3/uL (ref 0.7–4.0)
MCH: 30.2 pg (ref 26.0–34.0)
MCHC: 33.1 g/dL (ref 30.0–36.0)
MCV: 91 fL (ref 78.0–100.0)
MONO ABS: 1.1 10*3/uL — AB (ref 0.1–1.0)
Monocytes Relative: 8 % (ref 3–12)
Neutro Abs: 10.7 10*3/uL — ABNORMAL HIGH (ref 1.7–7.7)
Neutrophils Relative %: 80 % — ABNORMAL HIGH (ref 43–77)
Platelets: 171 10*3/uL (ref 150–400)
RBC: 3.78 MIL/uL — ABNORMAL LOW (ref 3.87–5.11)
RDW: 13.4 % (ref 11.5–15.5)
WBC: 13.2 10*3/uL — ABNORMAL HIGH (ref 4.0–10.5)

## 2014-01-01 LAB — COMPREHENSIVE METABOLIC PANEL
ALBUMIN: 3.4 g/dL — AB (ref 3.5–5.2)
ALT: 23 U/L (ref 0–35)
AST: 21 U/L (ref 0–37)
Alkaline Phosphatase: 109 U/L (ref 39–117)
BUN: 18 mg/dL (ref 6–23)
CALCIUM: 8.8 mg/dL (ref 8.4–10.5)
CO2: 25 mEq/L (ref 19–32)
CREATININE: 0.83 mg/dL (ref 0.50–1.10)
Chloride: 101 mEq/L (ref 96–112)
GFR calc Af Amer: 75 mL/min — ABNORMAL LOW (ref >90)
GFR, EST NON AFRICAN AMERICAN: 64 mL/min — AB (ref >90)
Glucose, Bld: 119 mg/dL — ABNORMAL HIGH (ref 70–99)
Potassium: 3.7 mEq/L (ref 3.7–5.3)
Sodium: 139 mEq/L (ref 137–147)
Total Bilirubin: 0.8 mg/dL (ref 0.3–1.2)
Total Protein: 6.7 g/dL (ref 6.0–8.3)

## 2014-01-01 LAB — GLUCOSE, CAPILLARY: GLUCOSE-CAPILLARY: 107 mg/dL — AB (ref 70–99)

## 2014-01-01 LAB — POC OCCULT BLOOD, ED: FECAL OCCULT BLD: NEGATIVE

## 2014-01-01 LAB — TSH: TSH: 0.218 u[IU]/mL — ABNORMAL LOW (ref 0.350–4.500)

## 2014-01-01 LAB — I-STAT CG4 LACTIC ACID, ED: LACTIC ACID, VENOUS: 0.82 mmol/L (ref 0.5–2.2)

## 2014-01-01 LAB — I-STAT TROPONIN, ED: Troponin i, poc: 0.01 ng/mL (ref 0.00–0.08)

## 2014-01-01 MED ORDER — ONDANSETRON HCL 4 MG/2ML IJ SOLN: 4.0000 mg | Freq: Four times a day (QID) | INTRAMUSCULAR | Status: DC | PRN

## 2014-01-01 MED ORDER — METOPROLOL TARTRATE 1 MG/ML IV SOLN
5.0000 mg | Freq: Four times a day (QID) | INTRAVENOUS | Status: DC
  Administered 2014-01-01 – 2014-01-02 (×3): 5 mg via INTRAVENOUS
  Filled 2014-01-01 (×6): qty 5

## 2014-01-01 MED ORDER — ALBUTEROL SULFATE (2.5 MG/3ML) 0.083% IN NEBU: 2.5000 mg | INHALATION_SOLUTION | RESPIRATORY_TRACT | Status: DC | PRN

## 2014-01-01 MED ORDER — ONDANSETRON HCL 4 MG PO TABS: 4.0000 mg | ORAL_TABLET | Freq: Four times a day (QID) | ORAL | Status: DC | PRN

## 2014-01-01 MED ORDER — LEVETIRACETAM 500 MG/5ML IV SOLN
750.0000 mg | INTRAVENOUS | Status: DC
  Administered 2014-01-01: 750 mg via INTRAVENOUS
  Filled 2014-01-01 (×2): qty 7.5

## 2014-01-01 MED ORDER — SODIUM CHLORIDE 0.9 % IV SOLN
3.0000 g | Freq: Once | INTRAVENOUS | Status: AC
  Administered 2014-01-01: 3 g via INTRAVENOUS
  Filled 2014-01-01: qty 3

## 2014-01-01 MED ORDER — ACETAMINOPHEN 650 MG RE SUPP: 650.0000 mg | Freq: Four times a day (QID) | RECTAL | Status: DC | PRN

## 2014-01-01 MED ORDER — LORAZEPAM 2 MG/ML IJ SOLN: 1.0000 mg | Freq: Once | INTRAMUSCULAR | Status: AC | PRN

## 2014-01-01 MED ORDER — ENOXAPARIN SODIUM 40 MG/0.4ML ~~LOC~~ SOLN
40.0000 mg | SUBCUTANEOUS | Status: DC
  Administered 2014-01-02 – 2014-01-06 (×5): 40 mg via SUBCUTANEOUS
  Filled 2014-01-01 (×6): qty 0.4

## 2014-01-01 MED ORDER — ACETAMINOPHEN 325 MG PO TABS: 650.0000 mg | ORAL_TABLET | Freq: Four times a day (QID) | ORAL | Status: DC | PRN

## 2014-01-01 MED ORDER — SODIUM CHLORIDE 0.9 % IJ SOLN
3.0000 mL | Freq: Two times a day (BID) | INTRAMUSCULAR | Status: DC
  Administered 2014-01-02 – 2014-01-06 (×8): 3 mL via INTRAVENOUS

## 2014-01-01 MED ORDER — SENNOSIDES-DOCUSATE SODIUM 8.6-50 MG PO TABS: 1.0000 | ORAL_TABLET | Freq: Every evening | ORAL | Status: DC | PRN

## 2014-01-01 NOTE — ED Provider Notes (Signed)
MSE was initiated and I personally evaluated the patient and placed orders (if any) at  2:38 PM on Jan 01, 2014.  The patient appears stable so that the remainder of the MSE may be completed by another provider.  Patient's last normal was 11 pm last night. Hard to arouse to morning and was confused as per family. Outside window for TPA. Patient alert, diffuse weakness on exam. Will initiate stroke and AMS workup.   Wandra Arthurs, MD 01/01/14 [phone]

## 2014-01-01 NOTE — H&P (Addendum)
[facility] History and Physical  Hailey Faulkner ELF:[id number] DOB: 1930/01/12 DOA: 01/01/2014  Referring physician: EDP PCP: Hailey Peaches, MD   Chief Complaint: slurred speech, lethargy  HPI: Hailey Faulkner is a 80 y.o. female  With h/o resected meningioma, seizures, vocal cord paralysis brought to ED by family who noted she was difficult to rouse today and had difficult to understand speech.  Pt reports feeling sick after eating [employer] last night, but was otherwise in her usual state of health.  Family noted she was sleeping later than usual. They checked on her at around 2 pm. She was lethargic and difficult to understand. They thought also her tongue seemed swollen.  They noted she had vomited in bed.   No cough, fever, chills. CT brain showed nothing acute. CXR showed no definite infiltrate. WBC 13000. UA negative.  Patient was out of town this week and missed several doses of keppra.   Review of Systems:  Limited, as patient difficult to understand and slow to respond.  Past Medical History  Diagnosis Date  . History of UTI   . Pancreatitis   . HBP (high blood pressure)   . OP (osteoporosis)   . Vocal cord paralysis     RIGHT VOCAL CORD  . History of malignant gastrointestinal stromal tumor (GIST)   . Hypothyroidism   . Seizure disorder   . SIADH (syndrome of inappropriate ADH production)   . Melanoma     vulvar  . Meningioma   . Gait instability   . CVA (cerebral infarction)    Past Surgical History  Procedure Laterality Date  . Thyroidectomy, partial  1970  . Cholecystectomy  10/2007  . Gastrectomy  2007  . Laryngoplasty     Social History:  reports that she has never smoked. She has never used smokeless tobacco. She reports that she does not drink alcohol or use illicit drugs.  Allergies  Allergen Reactions  . Hctz [Hydrochlorothiazide]   . Hydrocodone     Family History  Problem Relation Age of Onset  . Lung cancer Brother   .  Heart disease Brother   . Heart attack Brother      Prior to Admission medications   Medication Sig Start Date End Date Taking? Authorizing Provider  acetaminophen (TYLENOL) 325 MG tablet Take 650 mg by mouth every 6 (six) hours as needed.     Yes Historical Provider, MD  atenolol (TENORMIN) 100 MG tablet Take 100 mg by mouth daily.   Yes Historical Provider, MD  Calcium Carbonate-Vitamin D (CALCIUM + D PO) Take by mouth 2 (two) times daily.     Yes Historical Provider, MD  Cyanocobalamin (VITAMIN B-12 IJ) Inject as directed every 30 (thirty) days.     Yes Historical Provider, MD  doxazosin (CARDURA) 1 MG tablet Take 1 mg by mouth at bedtime.     Yes Historical Provider, MD  esomeprazole (NEXIUM) 40 MG capsule Take 40 mg by mouth daily before breakfast.     Yes Historical Provider, MD  Levetiracetam 750 MG TB24 Take 1 tablet (750 mg total) by mouth daily. 10/06/13  Yes Hulen Luster, DO  levothyroxine (SYNTHROID, LEVOTHROID) 125 MCG tablet Take 125 mcg by mouth daily.     Yes Historical Provider, MD  losartan (COZAAR) 100 MG tablet Take 100 mg by mouth daily.     Yes Historical Provider, MD  meclizine (ANTIVERT) 12.5 MG tablet Take 12.5 mg by mouth 3 (three) times daily as needed.  Yes Historical Provider, MD  polyethylene glycol (MIRALAX / GLYCOLAX) packet Take 17 g by mouth daily.   Yes Historical Provider, MD   Physical Exam: Filed Vitals:   01/01/14 1730  BP: 152/60  Pulse: 71  Temp:   Resp: 23    BP 152/60  Pulse 71  Temp(Src) 99.5 F (37.5 C) (Rectal)  Resp 23  SpO2 94% BP 152/60  Pulse 71  Temp(Src) 99.5 F (37.5 C) (Rectal)  Resp 23  SpO2 94%  General Appearance:    Asleep, arousable, slow to respond. Difficult to understand.  Head:    Right skull deformity  Eyes:    PERRL, conjunctiva/corneas clear, EOM's intact, fundi    benign, both eyes     Nose:   Nares normal, septum midline, mucosa normal, no drainage    or sinus tenderness  Throat:   Tongue  laceration noted on  Right. Dry mucous membranes  Neck:   Supple, symmetrical, trachea midline, no adenopathy;    thyroid:  no enlargement/tenderness/nodules; no carotid   bruit or JVD  Back:     Symmetric, no curvature, ROM normal, no CVA tenderness  Lungs:     Clear to auscultation bilaterally, respirations unlabored  Chest Wall:    No tenderness or deformity   Heart:    Regular rate and rhythm, S1 and S2 normal, no murmur, rub   or gallop     Abdomen:     Soft, non-tender, bowel sounds active all four quadrants,    no masses, no organomegaly  Genitalia:   deferred  Rectal:   deferred  Extremities:   Extremities normal, atraumatic, no cyanosis or edema  Pulses:   2+ and symmetric all extremities  Skin:   Skin color, texture, turgor normal, no rashes or lesions  Lymph nodes:   Cervical, supraclavicular, and axillary nodes normal  Neurologic:   Speech slow and halting. Whispers. CNII-XII intact, normal strength, sensation and reflexes throughout           Psych: cooperative  Labs on Admission:  Basic Metabolic Panel:  Recent Labs Lab 01/01/14 1548  NA 139  K 3.7  CL 101  CO2 25  GLUCOSE 119*  BUN 18  CREATININE 0.83  CALCIUM 8.8   Liver Function Tests:  Recent Labs Lab 01/01/14 1548  AST 21  ALT 23  ALKPHOS 109  BILITOT 0.8  PROT 6.7  ALBUMIN 3.4*   No results found for this basename: LIPASE, AMYLASE,  in the last 168 hours No results found for this basename: AMMONIA,  in the last 168 hours CBC:  Recent Labs Lab 01/01/14 1548  WBC 13.2*  NEUTROABS 10.7*  HGB 11.4*  HCT 34.4*  MCV 91.0  PLT 171   Cardiac Enzymes: No results found for this basename: CKTOTAL, CKMB, CKMBINDEX, TROPONINI,  in the last 168 hours  BNP (last 3 results) No results found for this basename: PROBNP,  in the last 8760 hours CBG: No results found for this basename: GLUCAP,  in the last 168 hours  Radiological Exams on Admission: Ct Head Wo Contrast  01/01/2014   CLINICAL  DATA:  Altered mental status  EXAM: CT HEAD WITHOUT CONTRAST  TECHNIQUE: Contiguous axial images were obtained from the base of the skull through the vertex without intravenous contrast.  COMPARISON:  11/01/2010  FINDINGS: Again noted status post right temporofrontal craniotomy. Underlying and to follow malacia in the right frontal and temporal lobes again noted. Ex vacuo dilatation of the right lateral ventricle again  noted. No intracranial hemorrhage, mass effect or midline shift. Ventricular size is stable from prior exam. No acute cortical infarction. No mass lesion is noted on this unenhanced scan. The visualized paranasal sinuses and mastoid air cells are unremarkable. Mild mucosal thickening noted posterior aspect of the right sphenoid sinus. Stable cerebral atrophy.  IMPRESSION: No acute intracranial abnormality. Stable cerebral atrophy. Stable postsurgical changes and encephalomalacia in right frontal temporal lobe.   Electronically Signed   By: Lahoma Crocker M.D.   On: 01/01/2014 14:58   Dg Chest Portable 1 View  01/01/2014   CLINICAL DATA:  Altered mental status.  Fever  EXAM: PORTABLE CHEST - 1 VIEW  COMPARISON:  08/11/2012  FINDINGS: Decreased lung volume compared with the prior study. Mild bibasilar atelectasis. Negative for heart failure or effusion.  IMPRESSION: Hypoventilation with mild bibasilar atelectasis   Electronically Signed   By: Franchot Gallo M.D.   On: 01/01/2014 15:53    EKG: NSR. Baseline wander  Assessment/Plan   Speech abnormality   Acute encephalopathy   NEOPLASM, BENIGN, STOMACH   Encephalopathy acute   Vocal cord paralysis   history of Malignant gastrointestinal stromal tumor (GIST) of stomach   Seizures   Essential hypertension, benign   Meningioma, resected  Suspect seizure/postictal given history of seizure, missed keppra doses, lethargy and tongue laceration.  Will continue keppra IV. Hold po meds until swallow screen. Check EEG.  Also consider CVA, so will get  MRI. If MRI positive, complete workup.  PT and speech eval. Neuro checks. Hold po meds for now.  Repeat CXR in am to r/o developing aspiration pneumonia.  Code Status: full Family Communication: at bedside Disposition Plan: home Time spent: 63 min  Delfina Redwood, MD [facility] Pager [phone]

## 2014-01-01 NOTE — ED Provider Notes (Signed)
I saw and evaluated the patient, reviewed the resident's note and I agree with the findings and plan.   EKG Interpretation   Date/Time:  Saturday Jan 01 2014 14:30:50 EDT Ventricular Rate:  84 PR Interval:  162 QRS Duration: 85 QT Interval:  401 QTC Calculation: 474 R Axis:   -10 Text Interpretation:  Sinus rhythm Atrial premature complex Baseline  wander in lead(s) II III aVF V2 V4 No significant change since last  tracing Confirmed by YAO  MD, DAVID ([id number]) on 01/01/2014 2:36:23 PM     Patient here with altered mental status times one day. Low-grade temp to noted on rectal exam. Neurological exam nonfocal. Suspect infectious etiology of her current symptoms. Will likely require admission.  Leota Jacobsen, MD 01/01/14 [phone]

## 2014-01-01 NOTE — ED Provider Notes (Signed)
CSN: [id number]     Arrival date & time 01/01/14  1420 History   First MD Initiated Contact with Patient 01/01/14 1423     Chief Complaint  Patient presents with  . Altered Mental Status     (Consider location/radiation/quality/duration/timing/severity/associated sxs/prior Treatment) Patient is a 80 y.o. female presenting with altered mental status.  Altered Mental Status Presenting symptoms: lethargy (with slowed speech pattern)   Severity:  Moderate Most recent episode:  Today Episode history:  Continuous Duration: Since waking this morning. Timing:  Constant Progression:  Partially resolved Chronicity:  New Context comment:  History of prior brain tumor resection with baseline of for ambulation and normal speech and memory. Associated symptoms: vomiting (Once overnight description of dark quality)   Associated symptoms: no bladder incontinence, no difficulty breathing, no fever, no rash and no seizures (Unwitnessed but history of)     Past Medical History  Diagnosis Date  . History of UTI   . Pancreatitis   . HBP (high blood pressure)   . OP (osteoporosis)   . Vocal cord paralysis     RIGHT VOCAL CORD  . History of malignant gastrointestinal stromal tumor (GIST)   . Hypothyroidism   . Seizure disorder   . SIADH (syndrome of inappropriate ADH production)   . Melanoma     vulvar  . Meningioma   . Gait instability   . CVA (cerebral infarction)    Past Surgical History  Procedure Laterality Date  . Thyroidectomy, partial  1970  . Cholecystectomy  10/2007  . Gastrectomy  2007  . Laryngoplasty     Family History  Problem Relation Age of Onset  . Lung cancer Brother   . Heart disease Brother   . Heart attack Brother    History  Substance Use Topics  . Smoking status: Never Smoker   . Smokeless tobacco: Never Used  . Alcohol Use: No   OB History   Grav Para Term Preterm Abortions TAB SAB Ect Mult Living                 Review of Systems  Constitutional:  Negative for fever.  Gastrointestinal: Positive for vomiting (Once overnight description of dark quality).  Genitourinary: Negative for bladder incontinence.  Skin: Negative for rash.  Neurological: Negative for seizures (Unwitnessed but history of).  All other systems reviewed and are negative.     Allergies  Hctz and Hydrocodone  Home Medications   Prior to Admission medications   Medication Sig Start Date End Date Taking? Authorizing Provider  acetaminophen (TYLENOL) 325 MG tablet Take 650 mg by mouth every 6 (six) hours as needed.     Yes Historical Provider, MD  atenolol (TENORMIN) 100 MG tablet Take 100 mg by mouth daily.   Yes Historical Provider, MD  Calcium Carbonate-Vitamin D (CALCIUM + D PO) Take by mouth 2 (two) times daily.     Yes Historical Provider, MD  Cyanocobalamin (VITAMIN B-12 IJ) Inject as directed every 30 (thirty) days.     Yes Historical Provider, MD  doxazosin (CARDURA) 1 MG tablet Take 1 mg by mouth at bedtime.     Yes Historical Provider, MD  esomeprazole (NEXIUM) 40 MG capsule Take 40 mg by mouth daily before breakfast.     Yes Historical Provider, MD  Levetiracetam 750 MG TB24 Take 1 tablet (750 mg total) by mouth daily. 10/06/13  Yes Hulen Luster, DO  levothyroxine (SYNTHROID, LEVOTHROID) 125 MCG tablet Take 125 mcg by mouth daily.  Yes Historical Provider, MD  losartan (COZAAR) 100 MG tablet Take 100 mg by mouth daily.     Yes Historical Provider, MD  meclizine (ANTIVERT) 12.5 MG tablet Take 12.5 mg by mouth 3 (three) times daily as needed.     Yes Historical Provider, MD  polyethylene glycol (MIRALAX / GLYCOLAX) packet Take 17 g by mouth daily.   Yes Historical Provider, MD   BP 175/74  Pulse 82  Temp(Src) 98.9 F (37.2 C) (Oral)  Resp 24  SpO2 95% Physical Exam  Constitutional: She is oriented to person, place, and time. She appears well-developed and well-nourished. She appears lethargic. No distress.  HENT:  Right craniotomy changes   Eyes: Conjunctivae are normal.  Neck: Neck supple. No tracheal deviation present.  Cardiovascular: Normal rate and regular rhythm.   Pulmonary/Chest: Effort normal. No respiratory distress.  Abdominal: Soft. She exhibits no distension.  Neurological: She is oriented to person, place, and time. She has normal strength. She appears lethargic. Coordination normal. GCS eye subscore is 4. GCS verbal subscore is 5. GCS motor subscore is 6.  Skin: Skin is warm and dry.  Psychiatric: She has a normal mood and affect. Her speech is slurred. She is slowed. Cognition and memory are not impaired.    ED Course  Procedures (including critical care time) Labs Review Labs Reviewed  URINE CULTURE  CULTURE, BLOOD (ROUTINE X 2)  CULTURE, BLOOD (ROUTINE X 2)  CBC WITH DIFFERENTIAL  COMPREHENSIVE METABOLIC PANEL  URINALYSIS, ROUTINE W REFLEX MICROSCOPIC  TSH  I-STAT TROPOININ, ED  POC OCCULT BLOOD, ED  I-STAT CG4 LACTIC ACID, ED    Imaging Review Ct Head Wo Contrast  01/01/2014   CLINICAL DATA:  Altered mental status  EXAM: CT HEAD WITHOUT CONTRAST  TECHNIQUE: Contiguous axial images were obtained from the base of the skull through the vertex without intravenous contrast.  COMPARISON:  11/01/2010  FINDINGS: Again noted status post right temporofrontal craniotomy. Underlying and to follow malacia in the right frontal and temporal lobes again noted. Ex vacuo dilatation of the right lateral ventricle again noted. No intracranial hemorrhage, mass effect or midline shift. Ventricular size is stable from prior exam. No acute cortical infarction. No mass lesion is noted on this unenhanced scan. The visualized paranasal sinuses and mastoid air cells are unremarkable. Mild mucosal thickening noted posterior aspect of the right sphenoid sinus. Stable cerebral atrophy.  IMPRESSION: No acute intracranial abnormality. Stable cerebral atrophy. Stable postsurgical changes and encephalomalacia in right frontal temporal  lobe.   Electronically Signed   By: Lahoma Crocker M.D.   On: 01/01/2014 14:58   Dg Chest Portable 1 View  01/01/2014   CLINICAL DATA:  Altered mental status.  Fever  EXAM: PORTABLE CHEST - 1 VIEW  COMPARISON:  08/11/2012  FINDINGS: Decreased lung volume compared with the prior study. Mild bibasilar atelectasis. Negative for heart failure or effusion.  IMPRESSION: Hypoventilation with mild bibasilar atelectasis   Electronically Signed   By: Franchot Gallo M.D.   On: 01/01/2014 15:53     EKG Interpretation   Date/Time:  Saturday Jan 01 2014 14:30:50 EDT Ventricular Rate:  84 PR Interval:  162 QRS Duration: 85 QT Interval:  401 QTC Calculation: 474 R Axis:   -10 Text Interpretation:  Sinus rhythm Atrial premature complex Baseline  wander in lead(s) II III aVF V2 V4 No significant change since last  tracing Confirmed by YAO  MD, DAVID ([id number]) on 01/01/2014 2:36:23 PM      MDM  Final diagnoses:  Aspiration pneumonia  Lethargy  Dysphagia   80 y.o. female presents with slowed speech and lethargic behavior according to her family. She lives alone and has caretakers at coming throughout the week to look after her. She was last no normal last night at 11 PM. She woke up today and was difficult to arouse by family and they could not understand her speech so they brought her for evaluation. Her speech is recovered as far as constant but she appears to be persistently slowed. They also state that her left eye would not open when she first woke up. The patient had coughing yesterday and overnight reportedly had an episode of emesis that was noted to be dark. She has not had any fevers at home. She is history of brain tumor resection but her baseline is normally active and her memory and speech are intact with a hoarse voice at baseline due to vocal cord paralysis.  Workup initiated with differential including ischemic stroke, head bleed, infection, metabolic dysfunction, aspiration, GI bleed, atypical  ACS or thyroid dysfunction. CT of the head with no acute changes from prior. EKG without ST or T wave changes concerning for myocardial ischemia. No delta wave, no prolonged QTc, no brugada to suggest arrhythmogenicity. Chest x-ray concerning for community acquired pneumonia or aspiration findings.   The patient was treated empirically for aspiration as her story is highly suggestive for it. It is possible the patient had an unwitnessed seizure that could account for vomiting and aspiration. She is continually slowed and could be postictal or having inflammatory response from aspiration event. The patient was admitted to internal medicine for further workup and management.  Leo Grosser, MD 01/02/14 [phone]

## 2014-01-01 NOTE — ED Notes (Signed)
Pt presents to ED via EMS from home with complaints of stroke like symptoms. Pt went to bed at 11 pm and was hard to awake this am, pt finally woke around 2pm with difficulty speaking and confusion per family. EMS states that pt was alert and oriented and speech was baseline per family.  CBG 184

## 2014-01-01 NOTE — ED Notes (Signed)
Admitting MD at bedside.

## 2014-01-02 ENCOUNTER — Inpatient Hospital Stay (HOSPITAL_COMMUNITY): Payer: Medicare HMO

## 2014-01-02 DIAGNOSIS — I639 Cerebral infarction, unspecified: Secondary | ICD-10-CM | POA: Diagnosis present

## 2014-01-02 DIAGNOSIS — R1310 Dysphagia, unspecified: Secondary | ICD-10-CM

## 2014-01-02 LAB — CBC WITH DIFFERENTIAL/PLATELET
BASOS ABS: 0 10*3/uL (ref 0.0–0.1)
Basophils Relative: 0 % (ref 0–1)
Eosinophils Absolute: 0.2 10*3/uL (ref 0.0–0.7)
Eosinophils Relative: 2 % (ref 0–5)
HCT: 36.4 % (ref 36.0–46.0)
Hemoglobin: 12.5 g/dL (ref 12.0–15.0)
LYMPHS ABS: 1.8 10*3/uL (ref 0.7–4.0)
Lymphocytes Relative: 18 % (ref 12–46)
MCH: 31.5 pg (ref 26.0–34.0)
MCHC: 34.3 g/dL (ref 30.0–36.0)
MCV: 91.7 fL (ref 78.0–100.0)
Monocytes Absolute: 0.8 10*3/uL (ref 0.1–1.0)
Monocytes Relative: 8 % (ref 3–12)
NEUTROS ABS: 7.3 10*3/uL (ref 1.7–7.7)
Neutrophils Relative %: 72 % (ref 43–77)
PLATELETS: 157 10*3/uL (ref 150–400)
RBC: 3.97 MIL/uL (ref 3.87–5.11)
RDW: 13.5 % (ref 11.5–15.5)
WBC: 10 10*3/uL (ref 4.0–10.5)

## 2014-01-02 LAB — BASIC METABOLIC PANEL
BUN: 15 mg/dL (ref 6–23)
CO2: 24 mEq/L (ref 19–32)
CREATININE: 0.79 mg/dL (ref 0.50–1.10)
Calcium: 8.6 mg/dL (ref 8.4–10.5)
Chloride: 103 mEq/L (ref 96–112)
GFR calc non Af Amer: 76 mL/min — ABNORMAL LOW (ref >90)
GFR, EST AFRICAN AMERICAN: 88 mL/min — AB (ref >90)
Glucose, Bld: 104 mg/dL — ABNORMAL HIGH (ref 70–99)
Potassium: 3.5 mEq/L — ABNORMAL LOW (ref 3.7–5.3)
Sodium: 140 mEq/L (ref 137–147)

## 2014-01-02 LAB — URINE CULTURE
Colony Count: NO GROWTH
Culture: NO GROWTH

## 2014-01-02 LAB — MAGNESIUM: MAGNESIUM: 2 mg/dL (ref 1.5–2.5)

## 2014-01-02 MED ORDER — METOPROLOL TARTRATE 1 MG/ML IV SOLN
7.5000 mg | Freq: Four times a day (QID) | INTRAVENOUS | Status: DC
  Administered 2014-01-02 – 2014-01-03 (×2): 7.5 mg via INTRAVENOUS
  Filled 2014-01-02 (×4): qty 10

## 2014-01-02 MED ORDER — SENNOSIDES-DOCUSATE SODIUM 8.6-50 MG PO TABS: 1.0000 | ORAL_TABLET | Freq: Every evening | ORAL | Status: DC | PRN

## 2014-01-02 MED ORDER — LEVOTHYROXINE SODIUM 100 MCG IV SOLR
62.5000 ug | Freq: Every day | INTRAVENOUS | Status: DC
  Filled 2014-01-02: qty 5

## 2014-01-02 MED ORDER — PANTOPRAZOLE SODIUM 40 MG IV SOLR
40.0000 mg | INTRAVENOUS | Status: DC
  Administered 2014-01-03: 40 mg via INTRAVENOUS
  Filled 2014-01-02 (×3): qty 40

## 2014-01-02 MED ORDER — ASPIRIN EC 81 MG PO TBEC
81.0000 mg | DELAYED_RELEASE_TABLET | Freq: Every day | ORAL | Status: DC
  Administered 2014-01-02 – 2014-01-04 (×3): 81 mg via ORAL
  Filled 2014-01-02 (×3): qty 1

## 2014-01-02 MED ORDER — LEVETIRACETAM ER 500 MG PO TB24
500.0000 mg | ORAL_TABLET | Freq: Every day | ORAL | Status: DC
  Administered 2014-01-02 – 2014-01-04 (×3): 500 mg via ORAL
  Filled 2014-01-02 (×4): qty 1

## 2014-01-02 MED ORDER — ACETAMINOPHEN 650 MG RE SUPP: 650.0000 mg | RECTAL | Status: DC | PRN

## 2014-01-02 MED ORDER — ENOXAPARIN SODIUM 40 MG/0.4ML ~~LOC~~ SOLN: 40.0000 mg | SUBCUTANEOUS | Status: DC

## 2014-01-02 MED ORDER — LEVETIRACETAM 250 MG PO TABS
250.0000 mg | ORAL_TABLET | Freq: Every morning | ORAL | Status: DC
Start: 2014-01-02 — End: 2014-01-05
  Administered 2014-01-02 – 2014-01-04 (×3): 250 mg via ORAL
  Filled 2014-01-02 (×4): qty 1

## 2014-01-02 MED ORDER — ACETAMINOPHEN 325 MG PO TABS
650.0000 mg | ORAL_TABLET | ORAL | Status: DC | PRN
  Filled 2014-01-02: qty 2

## 2014-01-02 NOTE — Progress Notes (Signed)
Patient admitted from [place]. Report received Patient oriented to room . No acute distress will continue to monitor.

## 2014-01-02 NOTE — Progress Notes (Signed)
Physical Therapy Evaluation Patient Details Name: Hailey Faulkner MRN: [id number] DOB: 07-Oct-1929 Today's Date: 01/02/2014   History of Present Illness  Patient is an 80 yo female admitted 01/01/14 due to lethargy and slurred speech.  Patient with h/o seizures, resected meningioma, vocal coard paralysis, HTN, GI tumor, melanoma, CVA, gait instability.  Clinical Impression  Patient presents with problems listed below.  Will benefit from acute PT to maximize independence prior to discharge home.      Follow Up Recommendations Home health PT;Supervision/Assistance - 24 hour (Patient declining SNF)    Equipment Recommendations  None recommended by PT    Recommendations for Other Services       Precautions / Restrictions Precautions Precautions: Fall Restrictions Weight Bearing Restrictions: No      Mobility  Bed Mobility Overal bed mobility: Needs Assistance Bed Mobility: Supine to Sit     Supine to sit: Min guard     General bed mobility comments: Patient able to move to sitting with use of bed rail and increased time.  Min guard for safety.  Transfers Overall transfer level: Needs assistance Equipment used: Rolling walker (2 wheeled) Transfers: Sit to/from Stand Sit to Stand: Min assist         General transfer comment: Verbal cues for hand placement.  Assist to rise to standing from bed and toilet.  Slightly unsteady in stance.  Ambulation/Gait Ambulation/Gait assistance: Min assist Ambulation Distance (Feet): 62 Feet Assistive device: Rolling walker (2 wheeled) Gait Pattern/deviations: Step-through pattern;Decreased stride length;Shuffle;Drifts right/left;Trunk flexed Gait velocity: Decreased Gait velocity interpretation: Below normal speed for age/gender General Gait Details: Verbal cues for safe use of RW.  Cues to stand upright - posture very flexed.  Stairs            Wheelchair Mobility    Modified Rankin (Stroke Patients Only)        Balance Overall balance assessment: Needs assistance Sitting-balance support: No upper extremity supported;Feet supported Sitting balance-Leahy Scale: Fair     Standing balance support: Bilateral upper extremity supported Standing balance-Leahy Scale: Poor                               Pertinent Vitals/Pain     Home Living Family/patient expects to be discharged to:: Private residence Living Arrangements: Alone Available Help at Discharge: Personal care attendant;Family;Available PRN/intermittently (Aide 2 days/week; sister, nephew and niece) Type of Home: House Home Access: Stairs to enter Entrance Stairs-Rails: None Entrance Stairs-Number of Steps: 1 Home Layout: One level Home Equipment: Environmental consultant - 2 wheels;Cane - single point;Shower seat;Bedside commode      Prior Function Level of Independence: Independent with assistive device(s);Needs assistance   Gait / Transfers Assistance Needed: Uses RW or cane for ambulation  ADL's / Homemaking Assistance Needed: Aide 2 days/week for meal prep, groceries, laundry, and housekeeping.  Patient reports independent with bathing.        Hand Dominance   Dominant Hand: Right    Extremity/Trunk Assessment   Upper Extremity Assessment: Generalized weakness           Lower Extremity Assessment: Generalized weakness         Communication   Communication: Expressive difficulties (States she is having more difficulty with talking)  Cognition Arousal/Alertness: Awake/alert Behavior During Therapy: Anxious Overall Cognitive Status: Within Functional Limits for tasks assessed  General Comments      Exercises        Assessment/Plan    PT Assessment Patient needs continued PT services  PT Diagnosis Difficulty walking;Abnormality of gait;Generalized weakness   PT Problem List Decreased strength;Decreased activity tolerance;Decreased balance;Decreased mobility;Decreased  knowledge of use of DME  PT Treatment Interventions DME instruction;Gait training;Functional mobility training;Therapeutic exercise;Balance training;Patient/family education   PT Goals (Current goals can be found in the Care Plan section) Acute Rehab PT Goals Patient Stated Goal: To return home PT Goal Formulation: With patient Time For Goal Achievement: 01/09/14 Potential to Achieve Goals: Good    Frequency Min 3X/week   Barriers to discharge Decreased caregiver support Patient lives alone.  Has aide 2 days/week.  Sister and her nephew/niece check on her.    Co-evaluation               End of Session Equipment Utilized During Treatment: Gait belt Activity Tolerance: Patient limited by fatigue Patient left: in chair;with call bell/phone within reach;with chair alarm set Nurse Communication: Mobility status (In chair with chair alarm)         Time: 4665-9935 PT Time Calculation (min): 50 min   Charges:   PT Evaluation $Initial PT Evaluation Tier I: 1 Procedure PT Treatments $Gait Training: 8-22 mins $Therapeutic Activity: 8-22 mins   PT G Codes:          Despina Pole 02-01-14, 12:56 PM Carita Pian. Sanjuana Kava, [place] Pager [phone]

## 2014-01-02 NOTE — Progress Notes (Signed)
[facility] PROGRESS NOTE  Hailey Faulkner TGG:[id number] DOB: [birth year] DOA: 01/01/2014 PCP: Criselda Peaches, MD  Assessment/Plan: #1 acute encephalopathy Questionable etiology. Differential includes acute CVA versus seizure disorder as patient was found to be lethargic, postictal, with tongue laceration and had missed some doses of the Keppra due to travel. Patient was placed on IV Keppra on admission. Patient currently alert oriented x3 and following commands. No seizures noted overnight per nursing. CT of the head is negative. MRI of the head is pending. EEG is pending. Repeat chest x-ray negative for any acute infiltrate. Continue IV Keppra for now. Will consult with neurology for further evaluation and management.  #2 history of seizure disorder See problem #1. EEG pending. Patient was noted to have missed doses of Keppra secondary to travel. Patient has been placed on IV Keppra for now. Follow.  #3 hypertension BP elevated this morning. Will increase IV Lopressor 7.5mg  4 times a day.  #4 Prophylaxis Lovenox for DVT prophylaxis  Code Status: Full Family Communication: Updated patient no family at bedside. Disposition Plan: Home when medically stable.   Consultants:  Neurology pending  Procedures:  CT head 01/01/2014  Chest x-ray 01/01/2014, 01/02/2014  Antibiotics:  None  HPI/Subjective: Patient with no complaints. No noted seizures overnight per nursing.  Objective: Filed Vitals:   01/02/14 0429  BP: 162/65  Pulse: 70  Temp: 98.3 F (36.8 C)  Resp: 18    Intake/Output Summary (Last 24 hours) at 01/02/14 0909 Last data filed at 01/02/14 0500  Gross per 24 hour  Intake      0 ml  Output    925 ml  Net   -925 ml   Filed Weights   01/01/14 2141  Weight: 82.056 kg (180 lb 14.4 oz)    Exam:   General:  NAD  Cardiovascular: RRR  Respiratory: Coarse bibasilar BS  Abdomen: Soft/NT/ND/+BS  Musculoskeletal: No c/c/e  Data  Reviewed: Basic Metabolic Panel:  Recent Labs Lab 01/01/14 1548  NA 139  K 3.7  CL 101  CO2 25  GLUCOSE 119*  BUN 18  CREATININE 0.83  CALCIUM 8.8   Liver Function Tests:  Recent Labs Lab 01/01/14 1548  AST 21  ALT 23  ALKPHOS 109  BILITOT 0.8  PROT 6.7  ALBUMIN 3.4*   No results found for this basename: LIPASE, AMYLASE,  in the last 168 hours No results found for this basename: AMMONIA,  in the last 168 hours CBC:  Recent Labs Lab 01/01/14 1548 01/02/14 0720  WBC 13.2* 10.0  NEUTROABS 10.7* 7.3  HGB 11.4* 12.5  HCT 34.4* 36.4  MCV 91.0 91.7  PLT 171 157   Cardiac Enzymes: No results found for this basename: CKTOTAL, CKMB, CKMBINDEX, TROPONINI,  in the last 168 hours BNP (last 3 results) No results found for this basename: PROBNP,  in the last 8760 hours CBG:  Recent Labs Lab 01/01/14 2016  GLUCAP 107*    No results found for this or any previous visit (from the past 240 hour(s)).   Studies: Ct Head Wo Contrast  01/01/2014   CLINICAL DATA:  Altered mental status  EXAM: CT HEAD WITHOUT CONTRAST  TECHNIQUE: Contiguous axial images were obtained from the base of the skull through the vertex without intravenous contrast.  COMPARISON:  11/01/2010  FINDINGS: Again noted status post right temporofrontal craniotomy. Underlying and to follow malacia in the right frontal and temporal lobes again noted. Ex vacuo dilatation of the right lateral ventricle again noted. No intracranial  hemorrhage, mass effect or midline shift. Ventricular size is stable from prior exam. No acute cortical infarction. No mass lesion is noted on this unenhanced scan. The visualized paranasal sinuses and mastoid air cells are unremarkable. Mild mucosal thickening noted posterior aspect of the right sphenoid sinus. Stable cerebral atrophy.  IMPRESSION: No acute intracranial abnormality. Stable cerebral atrophy. Stable postsurgical changes and encephalomalacia in right frontal temporal lobe.    Electronically Signed   By: Lahoma Crocker M.D.   On: 01/01/2014 14:58   Dg Chest Portable 1 View  01/01/2014   CLINICAL DATA:  Altered mental status.  Fever  EXAM: PORTABLE CHEST - 1 VIEW  COMPARISON:  08/11/2012  FINDINGS: Decreased lung volume compared with the prior study. Mild bibasilar atelectasis. Negative for heart failure or effusion.  IMPRESSION: Hypoventilation with mild bibasilar atelectasis   Electronically Signed   By: Franchot Gallo M.D.   On: 01/01/2014 15:53    Scheduled Meds: . enoxaparin (LOVENOX) injection  40 mg Subcutaneous Q24H  . levETIRAcetam  750 mg Intravenous Q24H  . metoprolol  5 mg Intravenous 4 times per day  . sodium chloride  3 mL Intravenous Q12H   Continuous Infusions:   Principal Problem:   Acute encephalopathy Active Problems:   Seizures   NEOPLASM, BENIGN, STOMACH   Vocal cord paralysis   history of Malignant gastrointestinal stromal tumor (GIST) of stomach   Essential hypertension, benign   Meningioma, resected   Speech abnormality    Time spent: Eldred MD [facility] Pager [phone]. If 7PM-7AM, please contact night-coverage at [email or web address], password [facility] 01/02/2014, 9:09 AM  LOS: 1 day

## 2014-01-02 NOTE — Consult Note (Signed)
Reason for Consult: Breakthrough Seizure Referring Physician: Dr Grandville Silos  CC: seizure  HPI: Hailey Faulkner is an 80 y.o. female with a history of meningioma resection in 1990 subsequent seizure disorder controlled with Keppra 750 mg daily. The patient's family brought her to the emergency department on 01/01/2014 for evaluation of lethargy and slurred speech. She had recently missed some doses of Keppra and it was noted that she had a laceration of her tongue. There was concern that the patient experienced a seizure that was not witnessed. The patient lives alone and apparently had stopped taking one or more for blood pressure medications recently as well. An initial CT of the head showed no acute changes; however, an MRI today revealed an acute infarct of the left thalamus. Most of the history was obtained from the patient's nephew who was in the room as well as the chart. The patient has a history of right vocal cord paralysis and she is dysarthric, which is new. She had not been on antiplatelet therapy prior to admission. Will need to start aspirin.  Past Medical History  Diagnosis Date  . History of UTI   . Pancreatitis   . HBP (high blood pressure)   . OP (osteoporosis)   . Vocal cord paralysis     RIGHT VOCAL CORD  . History of malignant gastrointestinal stromal tumor (GIST)   . Hypothyroidism   . Seizure disorder   . SIADH (syndrome of inappropriate ADH production)   . Melanoma     vulvar  . Meningioma   . Gait instability   . CVA (cerebral infarction)     Past Surgical History  Procedure Laterality Date  . Thyroidectomy, partial  1970  . Cholecystectomy  10/2007  . Gastrectomy  2007  . Laryngoplasty      Family History  Problem Relation Age of Onset  . Lung cancer Brother   . Heart disease Brother   . Heart attack Brother     Social History:  reports that she has never smoked. She has never used smokeless tobacco. She reports that she does not drink alcohol or use  illicit drugs.  Allergies  Allergen Reactions  . Hctz [Hydrochlorothiazide]   . Hydrocodone     Medications:  Scheduled: . enoxaparin (LOVENOX) injection  40 mg Subcutaneous Q24H  . levETIRAcetam  750 mg Intravenous Q24H  . metoprolol  5 mg Intravenous 4 times per day  . sodium chloride  3 mL Intravenous Q12H    ROS: History obtained from the patient and nephew  General ROS: negative for - chills, fatigue, fever, night sweats, weight gain or weight loss Psychological ROS: negative for - behavioral disorder, hallucinations, memory difficulties, mood swings or suicidal ideation Ophthalmic ROS: negative for - blurry vision, double vision, eye pain or loss of vision ENT ROS: negative for - epistaxis, nasal discharge, oral lesions, sore throat, tinnitus or vertigo Allergy and Immunology ROS: negative for - hives or itchy/watery eyes Hematological and Lymphatic ROS: negative for - bleeding problems, bruising or swollen lymph nodes Endocrine ROS: negative for - galactorrhea, hair pattern changes, polydipsia/polyuria or temperature intolerance Respiratory ROS: negative for - cough, hemoptysis, shortness of breath or wheezing Cardiovascular ROS: negative for - chest pain, dyspnea on exertion, edema or irregular heartbeat Gastrointestinal ROS: negative for - abdominal pain, diarrhea, hematemesis, or stool incontinence. Positive for recent nausea and vomiting. Genito-Urinary ROS: negative for - dysuria, hematuria, incontinence or urinary frequency/urgency Musculoskeletal ROS: negative for - joint swelling or muscular weakness Neurological ROS:  as noted in HPI Dermatological ROS: negative for rash and skin lesion changes   Physical Examination: Blood pressure 194/92, pulse 79, temperature 98.3 F (36.8 C), temperature source Oral, resp. rate 20, weight 180 lb 14.4 oz (82.056 kg), SpO2 95.00%.  Neurologic Examination Mental Status: Alert, oriented, thought content appropriate.  Speech  dysarthric but fluent.  Able to follow 3 step commands without difficulty. Cranial Nerves: II: Discs not visualized; Visual fields grossly normal, pupils equal, round, reactive to light and accommodation III,IV, VI: Slight right ptosis present, extra-ocular motions intact bilaterally V,VII: Very mild right lower facial weakness, facial light touch sensation normal bilaterally VIII: hearing somewhat impaired bilaterally IX,X: gag reflex present XI: bilateral shoulder shrug XII: midline tongue extension Motor: Right : Upper extremity   4/5    Left:     Upper extremity   5/5  Lower extremity   5/5     Lower extremity   5/5 Tone and bulk:normal tone throughout; no atrophy noted Sensory: Pinprick and light touch intact throughout, bilaterally Deep Tendon Reflexes: 2+ and symmetric throughout Plantars: Right: downgoing   Left: downgoing Cerebellar: Difficulty with finger to nose on the right secondary to weakness Gait: Deferred  Laboratory Studies:   Basic Metabolic Panel:  Recent Labs Lab 01/01/14 1548 01/02/14 0920  NA 139 140  K 3.7 3.5*  CL 101 103  CO2 25 24  GLUCOSE 119* 104*  BUN 18 15  CREATININE 0.83 0.79  CALCIUM 8.8 8.6  MG  --  2.0    Liver Function Tests:  Recent Labs Lab 01/01/14 1548  AST 21  ALT 23  ALKPHOS 109  BILITOT 0.8  PROT 6.7  ALBUMIN 3.4*   No results found for this basename: LIPASE, AMYLASE,  in the last 168 hours No results found for this basename: AMMONIA,  in the last 168 hours  CBC:  Recent Labs Lab 01/01/14 1548 01/02/14 0720  WBC 13.2* 10.0  NEUTROABS 10.7* 7.3  HGB 11.4* 12.5  HCT 34.4* 36.4  MCV 91.0 91.7  PLT 171 157    Cardiac Enzymes: No results found for this basename: CKTOTAL, CKMB, CKMBINDEX, TROPONINI,  in the last 168 hours  BNP: No components found with this basename: POCBNP,   CBG:  Recent Labs Lab 01/01/14 2016  GLUCAP 107*    Microbiology: Results for orders placed during the hospital encounter  of 04/21/09  URINE CULTURE     Status: None   Collection Time    04/21/09  1:56 PM      Result Value Ref Range Status   Specimen Description URINE, CLEAN CATCH   Final   Special Requests NONE   Final   Colony Count >=100,000 COLONIES/ML   Final   Culture     Final   Value: Multiple bacterial morphotypes present, none predominant. Suggest appropriate recollection if clinically indicated.   Report Status 04/23/2009 FINAL   Final  URINE CULTURE     Status: None   Collection Time    04/23/09  7:56 PM      Result Value Ref Range Status   Specimen Description URINE, RANDOM   Final   Special Requests NONE   Final   Colony Count NO GROWTH   Final   Culture NO GROWTH   Final   Report Status 04/25/2009 FINAL   Final    Coagulation Studies: No results found for this basename: LABPROT, INR,  in the last 72 hours  Urinalysis:  Recent Labs Lab 01/01/14 [place]  YELLOW  LABSPEC 1.019  PHURINE 6.0  GLUCOSEU NEGATIVE  HGBUR NEGATIVE  BILIRUBINUR NEGATIVE  KETONESUR NEGATIVE  PROTEINUR NEGATIVE  UROBILINOGEN 0.2  NITRITE NEGATIVE  LEUKOCYTESUR NEGATIVE    Lipid Panel:     Component Value Date/Time   CHOL  Value: 226        ATP III CLASSIFICATION:  <200     mg/dL   Desirable  200-239  mg/dL   Borderline High  >=240    mg/dL   High       * 04/22/2009 0403   TRIG 39 04/22/2009 0403   HDL 111 RESULTS CONFIRMED BY MANUAL DILUTION 04/22/2009 0403   CHOLHDL 2.0 04/22/2009 0403   VLDL 8 04/22/2009 0403   LDLCALC  Value: 107        Total Cholesterol/HDL:CHD Risk Coronary Heart Disease Risk Table                     Men   Women  1/2 Average Risk   3.4   3.3  Average Risk       5.0   4.4  2 X Average Risk   9.6   7.1  3 X Average Risk  23.4   11.0        Use the calculated Patient Ratio above and the CHD Risk Table to determine the patient's CHD Risk.        ATP III CLASSIFICATION (LDL):  <100     mg/dL   Optimal  100-129  mg/dL   Near or Above                    Optimal  130-159  mg/dL    Borderline  160-189  mg/dL   High  >190     mg/dL   Very High* 04/22/2009 0403    HgbA1C:  No results found for this basename: HGBA1C    Urine Drug Screen:   No results found for this basename: labopia, cocainscrnur, labbenz, amphetmu, thcu, labbarb    Alcohol Level: No results found for this basename: ETH,  in the last 168 hours  Other results: EKG: Sinus rhythm - rate 84 beats per minute. Please refer to the formal reading for complete details.  Imaging:  Ct Head Wo Contrast 01/01/2014    No acute intracranial abnormality. Stable cerebral atrophy. Stable postsurgical changes and encephalomalacia in right frontal temporal lobe.     Dg Chest Port 1 View 01/02/2014    Stable bibasilar atelectasis     Dg Chest Portable 1 View 01/01/2014     Hypoventilation with mild bibasilar atelectasis    MRI 01/02/2014 Acute infarct left thalamus. Stable postsurgical encephalomalacia right frontal lobe.    Assessment/Plan:  Pleasant 80 year old female admitted to [facility] yesterday for evaluation of lethargy and suspected breakthrough seizure. She is followed by Dr. Janann Colonel at [facility] neurology and has been on Keppra 750 mg daily; although, she recently missed 2-3 days of her medication. She had also decided to stop taking her blood pressure medications as she does not like the way they make her feel. An MRI today revealed an acute infarct of the left thalamus. She will need a full stroke workup including 2-D echo, carotid Dopplers, lipid profile, and hemoglobin A1c as well as antiplatelet therapy. Change to PO extended release Keppra.  Further assessment and plan to follow per Dr. Armida Sans.    Mikey Bussing PA-C [facility] Pager [phone] 01/02/2014, 1:04 PM   **Disclaimer: This  note was dictated with voice recognition software. Similar sounding words can inadvertently be transcribed and this note may contain transcription errors which may not have been corrected upon  publication of note.**  Patient seen and examined together with physician assistant and I concur with the assessment and plan.  Dorian Pod, MD

## 2014-01-03 ENCOUNTER — Inpatient Hospital Stay (HOSPITAL_COMMUNITY): Payer: Medicare HMO

## 2014-01-03 DIAGNOSIS — I369 Nonrheumatic tricuspid valve disorder, unspecified: Secondary | ICD-10-CM

## 2014-01-03 LAB — CBC
HCT: 36.9 % (ref 36.0–46.0)
HEMOGLOBIN: 12.1 g/dL (ref 12.0–15.0)
MCH: 30 pg (ref 26.0–34.0)
MCHC: 32.8 g/dL (ref 30.0–36.0)
MCV: 91.3 fL (ref 78.0–100.0)
Platelets: 166 10*3/uL (ref 150–400)
RBC: 4.04 MIL/uL (ref 3.87–5.11)
RDW: 13.1 % (ref 11.5–15.5)
WBC: 6.2 10*3/uL (ref 4.0–10.5)

## 2014-01-03 LAB — LIPID PANEL
CHOL/HDL RATIO: 3.5 ratio
Cholesterol: 231 mg/dL — ABNORMAL HIGH (ref 0–200)
HDL: 66 mg/dL (ref >39)
LDL Cholesterol: 133 mg/dL — ABNORMAL HIGH (ref 0–99)
Triglycerides: 162 mg/dL — ABNORMAL HIGH (ref <150)
VLDL: 32 mg/dL (ref 0–40)

## 2014-01-03 LAB — BASIC METABOLIC PANEL
BUN: 17 mg/dL (ref 6–23)
CO2: 22 meq/L (ref 19–32)
Calcium: 9.1 mg/dL (ref 8.4–10.5)
Chloride: 102 mEq/L (ref 96–112)
Creatinine, Ser: 0.86 mg/dL (ref 0.50–1.10)
GFR calc Af Amer: 71 mL/min — ABNORMAL LOW (ref >90)
GFR, EST NON AFRICAN AMERICAN: 62 mL/min — AB (ref >90)
GLUCOSE: 107 mg/dL — AB (ref 70–99)
POTASSIUM: 3.8 meq/L (ref 3.7–5.3)
Sodium: 138 mEq/L (ref 137–147)

## 2014-01-03 LAB — HEMOGLOBIN A1C
HEMOGLOBIN A1C: 6.5 % — AB (ref <5.7)
Mean Plasma Glucose: 140 mg/dL — ABNORMAL HIGH (ref <117)

## 2014-01-03 MED ORDER — METOPROLOL TARTRATE 1 MG/ML IV SOLN
10.0000 mg | Freq: Three times a day (TID) | INTRAVENOUS | Status: DC
  Administered 2014-01-03 – 2014-01-05 (×6): 10 mg via INTRAVENOUS
  Filled 2014-01-03 (×6): qty 10

## 2014-01-03 MED ORDER — LORAZEPAM 2 MG/ML IJ SOLN
0.5000 mg | Freq: Four times a day (QID) | INTRAMUSCULAR | Status: DC | PRN
  Administered 2014-01-03: 0.5 mg via INTRAVENOUS
  Filled 2014-01-03: qty 1

## 2014-01-03 MED ORDER — LORAZEPAM 2 MG/ML IJ SOLN: 0.5000 mg | Freq: Once | INTRAMUSCULAR | Status: DC

## 2014-01-03 MED ORDER — SIMVASTATIN 20 MG PO TABS
20.0000 mg | ORAL_TABLET | Freq: Every day | ORAL | Status: DC
  Administered 2014-01-03 – 2014-01-05 (×3): 20 mg via ORAL
  Filled 2014-01-03 (×4): qty 1

## 2014-01-03 MED ORDER — LEVOTHYROXINE SODIUM 125 MCG PO TABS
125.0000 ug | ORAL_TABLET | Freq: Every day | ORAL | Status: DC
  Administered 2014-01-03 – 2014-01-06 (×4): 125 ug via ORAL
  Filled 2014-01-03 (×5): qty 1

## 2014-01-03 NOTE — Evaluation (Signed)
Clinical/Bedside Swallow Evaluation Patient Details  Name: Shawnee Gambone MRN: [id number] Date of Birth: [birth year]  Today's Date: 01/03/2014 Time: 1136-1210 SLP Time Calculation (min): 34 min  Past Medical History:  Past Medical History  Diagnosis Date  . History of UTI   . Pancreatitis   . HBP (high blood pressure)   . OP (osteoporosis)   . Vocal cord paralysis     RIGHT VOCAL CORD  . History of malignant gastrointestinal stromal tumor (GIST)   . Hypothyroidism   . Seizure disorder   . SIADH (syndrome of inappropriate ADH production)   . Melanoma     vulvar  . Meningioma   . Gait instability   . CVA (cerebral infarction)    Past Surgical History:  Past Surgical History  Procedure Laterality Date  . Thyroidectomy, partial  1970  . Cholecystectomy  10/2007  . Gastrectomy  2007  . Laryngoplasty     HPI:  80 yo female with mental status change, CVA versus seizure disorder as patient was found to be lethargic, postictal, with tongue laceration and had missed some doses of the Keppra due to travel.  Patient was placed on IV Keppra on admission. Pt found to have left thalamic cva per imaging study 5/24, CXR 5/24 negative - stable ATX. PMH + for meningioma s/p resection, vocal fold paralysis s/p laryngoplasty, seizure.  OT reports pt coughing with water today and RN confirms.  Pt resides alone and nephew checks on her on occasion.     Assessment / Plan / Recommendation Clinical Impression  Pt presents with acute exacerbation of chronic dysphagia and dysphonia premorbid from surgery damage with exacerbation due to cva.  Weak breathy phonation with pt ability to state 1-2 words per breath group only.  Wet voice noted at baseline that worsened with intake.  Cough noted with nectar via straw and solid cracker.  Improved tolerance of thin & nectar via cup and yogurt noted without overt coughing/choking.  Pt admits to premorbid dysphagia to solids indicating "difficult to go down"  pointing to pharynx and liquids are 'slow to clear".  Per pt/nephew pt's dysphagia has worsened x6 months.  Pt has maintained weight and he not had pulmonary infections - however she is at increased risk currently due to worsening function, decreased ambulation from medical event.    Recommend to continue puree/thin with strict precautions as xray reports being unable to conduct MBS today.  MBS in am.   SLP instructed pt/nephew Jimmy to importance of very strict aspiration precautions and to cease po if not tolerating.  Both reported understanding and agree to [facility] next am.  SLP suspects component of chronic aspiration for which pt has managed since 2007 when she reports vocal cords damaged by stomach surgery.       Aspiration Risk  Moderate    Diet Recommendation Dysphagia 1 (Puree);Thin liquid   Liquid Administration via: Cup;No straw Medication Administration: Whole meds with puree Supervision: Patient able to self feed Compensations: Slow rate;Small sips/bites;Hard cough after swallow;Multiple dry swallows after each bite/sip Postural Changes and/or Swallow Maneuvers: Seated upright 90 degrees;Upright 30-60 min after meal    Other  Recommendations Recommended Consults: MBS (5/26 per xray) Oral Care Recommendations: Oral care BID   Follow Up Recommendations  Skilled Nursing facility    Frequency and Duration min 2x/week  2 weeks   Pertinent Vitals/Pain Afebrile, decreased     Swallow Study Prior Functional Status  Type of Home: House Available Help at Discharge: Personal care attendant;Family;Available  PRN/intermittently Education: worked as Licensed conveyancer for 31 years Vocation: Retired    Solicitor Date of Onset: 01/03/14 HPI: 80 yo female with mental status change, CVA versus seizure disorder as patient was found to be lethargic, postictal, with tongue laceration and had missed some doses of the Keppra due to travel.  Patient was placed on IV Keppra on admission. Pt found to  have left thalamic cva per imaging study 5/24, CXR 5/24 negative - stable ATX. PMH + for meningioma s/p resection, vocal fold paralysis s/p laryngoplasty, seizure.  OT reports pt coughing with water today and RN confirms.  Pt resides alone and nephew checks on her on occasion.   Type of Study: Bedside swallow evaluation Diet Prior to this Study: Dysphagia 1 (puree);Thin liquids Temperature Spikes Noted: No Respiratory Status: Room air History of Recent Intubation: No Behavior/Cognition: Alert;Cooperative;Pleasant mood Oral Cavity - Dentition: Adequate natural dentition;Poor condition (missing upper right dentition - awaiting dentist appt for possible implant ) Self-Feeding Abilities: Able to feed self Patient Positioning: Upright in chair Baseline Vocal Quality: Wet;Low vocal intensity;Breathy Volitional Cough: Weak Volitional Swallow: Unable to elicit    Oral/Motor/Sensory Function Overall Oral Motor/Sensory Function:  (decreased lingual movement, secretions retained in oral cavity, decreased right facial sensation - due to trigeminal nerve impact)   Ice Chips Ice chips: Not tested   Thin Liquid Thin Liquid: Impaired Presentation: Cup;Self Fed Oral Phase Impairments: Reduced lingual movement/coordination Pharyngeal  Phase Impairments: Suspected delayed Swallow;Multiple swallows    Nectar Thick Nectar Thick Liquid: Impaired Presentation: Self Fed;Cup;Straw Oral Phase Impairments: Reduced lingual movement/coordination Pharyngeal Phase Impairments: Multiple swallows;Wet Vocal Quality;Cough - Immediate Other Comments: cough with intake via straw - much improved tolerance via cup   Honey Thick Honey Thick Liquid: Not tested   Puree Puree: Impaired Presentation: Spoon Oral Phase Impairments: Reduced lingual movement/coordination Oral Phase Functional Implications: Prolonged oral transit Pharyngeal Phase Impairments: Multiple swallows   Solid   GO    Solid: Impaired Presentation: Self  Fed Oral Phase Impairments: Reduced lingual movement/coordination;Impaired anterior to posterior transit Pharyngeal Phase Impairments: Multiple swallows;Cough - Immediate       Luanna Salk, [place] [facility] SLP [phone]

## 2014-01-03 NOTE — Progress Notes (Signed)
Stroke Team Progress Note  HISTORY Hailey Faulkner is an 80 y.o. female with a history of meningioma resection in 1990 subsequent seizure disorder controlled with Keppra 750 mg daily. The patient's family brought her to the emergency department on 01/01/2014 for evaluation of lethargy and slurred speech. She had recently missed some doses of Keppra and it was noted that she had a laceration of her tongue. There was concern that the patient experienced a seizure that was not witnessed. The patient lives alone and apparently had stopped taking one or more for blood pressure medications recently as well. An initial CT of the head showed no acute changes; however, an MRI today revealed an acute infarct of the left thalamus. Most of the history was obtained from the patient's nephew who was in the room as well as the chart. The patient has a history of right vocal cord paralysis and she is dysarthric, which is new. She had not been on antiplatelet therapy prior to admission. Will need to start aspirin.   Patient was not administerd TPA secondary to being outside the window. She was admitted to [facility] further evaluation and treatment.  SUBJECTIVE Resting comfortably. No acute overnight events.  OBJECTIVE Most recent Vital Signs: Filed Vitals:   01/03/14 0200 01/03/14 0415 01/03/14 0620 01/03/14 1029  BP: 166/72 168/76 178/76 164/89  Pulse: 76 77 77 79  Temp: 98.7 F (37.1 C) 98.3 F (36.8 C) 98 F (36.7 C) 98 F (36.7 C)  TempSrc: Oral Oral Oral Oral  Resp: 20 18 20 20   Height:      Weight:      SpO2: 96% 95% 95% 96%   CBG (last 3)   Recent Labs  01/01/14 2016  GLUCAP 107*    IV Fluid Intake:     MEDICATIONS  . aspirin EC  81 mg Oral Daily  . enoxaparin (LOVENOX) injection  40 mg Subcutaneous Q24H  . levETIRAcetam  500 mg Oral Daily  . levETIRAcetam  250 mg Oral q morning - 10a  . levothyroxine  125 mcg Oral QAC breakfast  . metoprolol  10 mg Intravenous 3 times per day  .  pantoprazole (PROTONIX) IV  40 mg Intravenous Q24H  . simvastatin  20 mg Oral q1800  . sodium chloride  3 mL Intravenous Q12H   PRN:  acetaminophen, acetaminophen, albuterol, ondansetron (ZOFRAN) IV, ondansetron, senna-docusate  Diet:  Dysphagia  Activity: Up with assistance DVT Prophylaxis:  lovenox  CLINICALLY SIGNIFICANT STUDIES Basic Metabolic Panel:  Recent Labs Lab 01/01/14 1548 01/02/14 0920 01/03/14 0345  NA 139 140 138  K 3.7 3.5* 3.8  CL 101 103 102  CO2 25 24 22   GLUCOSE 119* 104* 107*  BUN 18 15 17   CREATININE 0.83 0.79 0.86  CALCIUM 8.8 8.6 9.1  MG  --  2.0  --    Liver Function Tests:  Recent Labs Lab 01/01/14 1548  AST 21  ALT 23  ALKPHOS 109  BILITOT 0.8  PROT 6.7  ALBUMIN 3.4*   CBC:  Recent Labs Lab 01/01/14 1548 01/02/14 0720 01/03/14 0345  WBC 13.2* 10.0 6.2  NEUTROABS 10.7* 7.3  --   HGB 11.4* 12.5 12.1  HCT 34.4* 36.4 36.9  MCV 91.0 91.7 91.3  PLT 171 157 166   Coagulation: No results found for this basename: LABPROT, INR,  in the last 168 hours Cardiac Enzymes: No results found for this basename: CKTOTAL, CKMB, CKMBINDEX, TROPONINI,  in the last 168 hours Urinalysis:  Recent Labs Lab  01/01/14 [place] 1.019  PHURINE 6.0  GLUCOSEU NEGATIVE  HGBUR NEGATIVE  BILIRUBINUR NEGATIVE  KETONESUR NEGATIVE  PROTEINUR NEGATIVE  UROBILINOGEN 0.2  NITRITE NEGATIVE  LEUKOCYTESUR NEGATIVE   Lipid Panel    Component Value Date/Time   CHOL 231* 01/03/2014 0345   TRIG 162* 01/03/2014 0345   HDL 66 01/03/2014 0345   CHOLHDL 3.5 01/03/2014 0345   VLDL 32 01/03/2014 0345   LDLCALC 133* 01/03/2014 0345   HgbA1C  No results found for this basename: HGBA1C    Urine Drug Screen:   No results found for this basename: labopia, cocainscrnur, labbenz, amphetmu, thcu, labbarb    Alcohol Level: No results found for this basename: ETH,  in the last 168 hours  Ct Head Wo Contrast  01/01/2014   CLINICAL DATA:  Altered  mental status  EXAM: CT HEAD WITHOUT CONTRAST  TECHNIQUE: Contiguous axial images were obtained from the base of the skull through the vertex without intravenous contrast.  COMPARISON:  11/01/2010  FINDINGS: Again noted status post right temporofrontal craniotomy. Underlying and to follow malacia in the right frontal and temporal lobes again noted. Ex vacuo dilatation of the right lateral ventricle again noted. No intracranial hemorrhage, mass effect or midline shift. Ventricular size is stable from prior exam. No acute cortical infarction. No mass lesion is noted on this unenhanced scan. The visualized paranasal sinuses and mastoid air cells are unremarkable. Mild mucosal thickening noted posterior aspect of the right sphenoid sinus. Stable cerebral atrophy.  IMPRESSION: No acute intracranial abnormality. Stable cerebral atrophy. Stable postsurgical changes and encephalomalacia in right frontal temporal lobe.   Electronically Signed   By: Lahoma Crocker M.D.   On: 01/01/2014 14:58   Mr Brain Wo Contrast  01/02/2014   CLINICAL DATA:  Dysarthria.  History of meningioma resection.  EXAM: MRI HEAD WITHOUT CONTRAST  TECHNIQUE: Multiplanar, multiecho pulse sequences of the brain and surrounding structures were obtained without intravenous contrast.  COMPARISON:  CT head 01/01/2014  FINDINGS: Acute infarct in the left lateral thalamus measuring approximately 10 x 12 mm. No other acute infarct identified.  Right frontal craniectomy for tumor resection. Large area of encephalomalacia in the right frontal lobe is stable. Right frontal horn is enlarged. Ventricles are diffusely dilated but stable from prior studies.  Image quality degraded by motion.  Negative for hemorrhage or mass.  IMPRESSION: Acute infarct left thalamus.  Stable postsurgical encephalomalacia right frontal lobe.   Electronically Signed   By: Franchot Gallo M.D.   On: 01/02/2014 12:52   Dg Chest Port 1 View  01/02/2014   CLINICAL DATA:  Shortness of  breath, possible aspiration  EXAM: PORTABLE CHEST - 1 VIEW  COMPARISON:  DG CHEST 1V PORT dated 01/01/2014  FINDINGS: Stable cardiac and mediastinal contours. Unchanged bilateral lower lung heterogeneous opacities. No pleural effusion or pneumothorax. Regional skeleton unremarkable.  IMPRESSION: Stable bibasilar atelectasis   Electronically Signed   By: Lovey Newcomer M.D.   On: 01/02/2014 11:20   Dg Chest Portable 1 View  01/01/2014   CLINICAL DATA:  Altered mental status.  Fever  EXAM: PORTABLE CHEST - 1 VIEW  COMPARISON:  08/11/2012  FINDINGS: Decreased lung volume compared with the prior study. Mild bibasilar atelectasis. Negative for heart failure or effusion.  IMPRESSION: Hypoventilation with mild bibasilar atelectasis   Electronically Signed   By: Franchot Gallo M.D.   On: 01/01/2014 15:53    CT of the brain    MRI of the brain  MRA of the brain    2D Echocardiogram    Carotid Doppler    CXR    Therapy Recommendations pending  Physical Exam  Mental Status:  Alert, oriented, thought content appropriate. Speech dysarthric but fluent. Able to follow 3 step commands without difficulty.  Cranial Nerves:  II:  Visual fields grossly normal, pupils equal, round, reactive to light III,IV, VI: Slight right ptosis present, extra-ocular motions intact bilaterally  V,VII: Very mild right lower facial weakness, facial light touch sensation normal bilaterally  Motor:  Right : Upper extremity 4/5 Left: Upper extremity 5/5  Lower extremity 5/5 Lower extremity 5/5  Tone and bulk:normal tone throughout; no atrophy noted  Sensory: light touch intact throughout, bilaterally  Cerebellar:  Difficulty with finger to nose on the right secondary to weakness  ASSESSMENT Ms. Hailey Faulkner is a 80 y.o. female presenting with lethargy and dysarthria, found to have an acute infarct in the left thalamus. No tPA given as outside the window. Imaging confirms a left thalamic infarct. Infarct felt to be  thrombotic secondary to small vessel disease.  On no anticoagulatuion prior to admission. Now on ASA 81mg  daily for secondary stroke prevention. Patient with resultant right sided weakness. Stroke work up underway.   seizures  LDL  Meningioma  HTN   Hospital day # 2  TREATMENT/PLAN  Continue ASA 81mg  daily  2D echo and carotid doppler  Lipid panel and hemoglobin A1c  Rehab evaluation  Risk factor modification  Continue Keppra XR 750mg  daily    Jim Like, DO [facility] Pager: [phone]   To contact Stroke Continuity provider, please refer to [email or web address] After hours, contact General Neurology

## 2014-01-03 NOTE — Progress Notes (Signed)
[facility] PROGRESS NOTE  Kiahna Banghart BPZ:[id number] DOB: 11-19-1929 DOA: 01/01/2014 PCP: Criselda Peaches, MD  Assessment/Plan: #1 acute encephalopathy Likely secondary to acute CVA +/- seizure disorder as patient was found to be lethargic, postictal, with tongue laceration and had missed some doses of the Keppra due to travel. Patient was placed on IV Keppra on admission. Patient currently alert oriented x3 and following commands. No seizures noted overnight per nursing. CT of the head is negative. MRI of the head is consistent with acute thalamic stroke. EEG is pending. Repeat chest x-ray negative for any acute infiltrate. Continue IV Keppra for now. Neurology following and appreciate input and recommendations.   #2 acute thalamic stroke Questionable etiology. Patient was not on aspirin prior to admission. Will check a 2-D echo, carotid Dopplers, started on a statin. Continue aspirin for secondary stroke prevention. Neurology following.  #2 history of seizure disorder See problem #1. EEG pending. Patient was noted to have missed doses of Keppra secondary to travel. Patient has been placed on IV Keppra for now. Neurology following.   #3 hypertension BP elevated this morning. Will increase IV Lopressor 10mg  3 times a day.  #4 Prophylaxis Lovenox for DVT prophylaxis  Code Status: Full Family Communication: Updated patient no family at bedside. Disposition Plan: Home when medically stable.   Consultants:  Neurology: Dr. Aram Beecham 01/02/2014  Procedures:  CT head 01/01/2014  Chest x-ray 01/01/2014, 01/02/2014  MRI head 01/02/2014  EEG pending  Antibiotics:  None  HPI/Subjective: Patient with no complaints. No noted seizures overnight per nursing. Patient sitting up in chair.  Objective: Filed Vitals:   01/03/14 1029  BP: 164/89  Pulse: 79  Temp: 98 F (36.7 C)  Resp: 20    Intake/Output Summary (Last 24 hours) at 01/03/14 1440 Last data filed at  01/03/14 1249  Gross per 24 hour  Intake    100 ml  Output      0 ml  Net    100 ml   Filed Weights   01/01/14 2141 01/02/14 2007  Weight: 82.056 kg (180 lb 14.4 oz) 77.565 kg (171 lb)    Exam:   General:  NAD. Patient with dysarthric speech and a soft voice.  Cardiovascular: RRR  Respiratory: CTAB  Abdomen: Soft/NT/ND/+BS  Musculoskeletal: No c/c/e  Data Reviewed: Basic Metabolic Panel:  Recent Labs Lab 01/01/14 1548 01/02/14 0920 01/03/14 0345  NA 139 140 138  K 3.7 3.5* 3.8  CL 101 103 102  CO2 25 24 22   GLUCOSE 119* 104* 107*  BUN 18 15 17   CREATININE 0.83 0.79 0.86  CALCIUM 8.8 8.6 9.1  MG  --  2.0  --    Liver Function Tests:  Recent Labs Lab 01/01/14 1548  AST 21  ALT 23  ALKPHOS 109  BILITOT 0.8  PROT 6.7  ALBUMIN 3.4*   No results found for this basename: LIPASE, AMYLASE,  in the last 168 hours No results found for this basename: AMMONIA,  in the last 168 hours CBC:  Recent Labs Lab 01/01/14 1548 01/02/14 0720 01/03/14 0345  WBC 13.2* 10.0 6.2  NEUTROABS 10.7* 7.3  --   HGB 11.4* 12.5 12.1  HCT 34.4* 36.4 36.9  MCV 91.0 91.7 91.3  PLT 171 157 166   Cardiac Enzymes: No results found for this basename: CKTOTAL, CKMB, CKMBINDEX, TROPONINI,  in the last 168 hours BNP (last 3 results) No results found for this basename: PROBNP,  in the last 8760 hours CBG:  Recent Labs  Lab 01/01/14 2016  GLUCAP 107*    Recent Results (from the past 240 hour(s))  URINE CULTURE     Status: None   Collection Time    01/01/14  4:04 PM      Result Value Ref Range Status   Specimen Description URINE, CATHETERIZED   Final   Special Requests NONE   Final   Culture  Setup Time     Final   Value: 01/01/2014 21:20     Performed at [place]     Final   Value: NO GROWTH     Performed at [facility]   Culture     Final   Value: NO GROWTH     Performed at [facility]   Report Status 01/02/2014 FINAL    Final     Studies: Ct Head Wo Contrast  01/01/2014   CLINICAL DATA:  Altered mental status  EXAM: CT HEAD WITHOUT CONTRAST  TECHNIQUE: Contiguous axial images were obtained from the base of the skull through the vertex without intravenous contrast.  COMPARISON:  11/01/2010  FINDINGS: Again noted status post right temporofrontal craniotomy. Underlying and to follow malacia in the right frontal and temporal lobes again noted. Ex vacuo dilatation of the right lateral ventricle again noted. No intracranial hemorrhage, mass effect or midline shift. Ventricular size is stable from prior exam. No acute cortical infarction. No mass lesion is noted on this unenhanced scan. The visualized paranasal sinuses and mastoid air cells are unremarkable. Mild mucosal thickening noted posterior aspect of the right sphenoid sinus. Stable cerebral atrophy.  IMPRESSION: No acute intracranial abnormality. Stable cerebral atrophy. Stable postsurgical changes and encephalomalacia in right frontal temporal lobe.   Electronically Signed   By: Lahoma Crocker M.D.   On: 01/01/2014 14:58   Mr Brain Wo Contrast  01/02/2014   CLINICAL DATA:  Dysarthria.  History of meningioma resection.  EXAM: MRI HEAD WITHOUT CONTRAST  TECHNIQUE: Multiplanar, multiecho pulse sequences of the brain and surrounding structures were obtained without intravenous contrast.  COMPARISON:  CT head 01/01/2014  FINDINGS: Acute infarct in the left lateral thalamus measuring approximately 10 x 12 mm. No other acute infarct identified.  Right frontal craniectomy for tumor resection. Large area of encephalomalacia in the right frontal lobe is stable. Right frontal horn is enlarged. Ventricles are diffusely dilated but stable from prior studies.  Image quality degraded by motion.  Negative for hemorrhage or mass.  IMPRESSION: Acute infarct left thalamus.  Stable postsurgical encephalomalacia right frontal lobe.   Electronically Signed   By: Franchot Gallo M.D.   On:  01/02/2014 12:52   Dg Chest Port 1 View  01/02/2014   CLINICAL DATA:  Shortness of breath, possible aspiration  EXAM: PORTABLE CHEST - 1 VIEW  COMPARISON:  DG CHEST 1V PORT dated 01/01/2014  FINDINGS: Stable cardiac and mediastinal contours. Unchanged bilateral lower lung heterogeneous opacities. No pleural effusion or pneumothorax. Regional skeleton unremarkable.  IMPRESSION: Stable bibasilar atelectasis   Electronically Signed   By: Lovey Newcomer M.D.   On: 01/02/2014 11:20   Dg Chest Portable 1 View  01/01/2014   CLINICAL DATA:  Altered mental status.  Fever  EXAM: PORTABLE CHEST - 1 VIEW  COMPARISON:  08/11/2012  FINDINGS: Decreased lung volume compared with the prior study. Mild bibasilar atelectasis. Negative for heart failure or effusion.  IMPRESSION: Hypoventilation with mild bibasilar atelectasis   Electronically Signed   By: Franchot Gallo M.D.   On:  01/01/2014 15:53    Scheduled Meds: . aspirin EC  81 mg Oral Daily  . enoxaparin (LOVENOX) injection  40 mg Subcutaneous Q24H  . levETIRAcetam  500 mg Oral Daily  . levETIRAcetam  250 mg Oral q morning - 10a  . levothyroxine  125 mcg Oral QAC breakfast  . metoprolol  10 mg Intravenous 3 times per day  . pantoprazole (PROTONIX) IV  40 mg Intravenous Q24H  . simvastatin  20 mg Oral q1800  . sodium chloride  3 mL Intravenous Q12H   Continuous Infusions:   Principal Problem:   Acute encephalopathy Active Problems:   Seizures   CVA (cerebral infarction)   NEOPLASM, BENIGN, STOMACH   Vocal cord paralysis   history of Malignant gastrointestinal stromal tumor (GIST) of stomach   Essential hypertension, benign   Meningioma, resected   Speech abnormality    Time spent: [place] MD [facility] Pager [phone]. If 7PM-7AM, please contact night-coverage at [email or web address], password [facility] 01/03/2014, 2:40 PM  LOS: 2 days

## 2014-01-03 NOTE — Progress Notes (Signed)
Echocardiogram 2D Echocardiogram has been performed.  Alyson Locket Dezire Turk 01/03/2014, 3:53 PM

## 2014-01-03 NOTE — Evaluation (Addendum)
Occupational Therapy Evaluation Patient Details Name: Hailey Faulkner MRN: [id number] DOB: 1930/03/05 Today's Date: 01/03/2014    History of Present Illness Patient is an 80 yo female admitted 01/01/14 due to lethargy and slurred speech.  Patient with h/o seizures, resected meningioma, vocal coard paralysis, HTN, GI tumor, melanoma, CVA, gait instability. MRI revealed acute infarct in left thalamus.   Clinical Impression   Pt admitted with above. Pt independent with ADLs, PTA. Feel pt will benefit from acute OT to increase independence prior to d/c. Spoke with pt and family member about recommending SNF for additional rehab, and pt seemed agreeable to idea.    Follow Up Recommendations  SNF;Supervision/Assistance - 24 hour    Equipment Recommendations  None recommended by OT    Recommendations for Other Services       Precautions / Restrictions Precautions Precautions: Fall Restrictions Weight Bearing Restrictions: No      Mobility Bed Mobility               General bed mobility comments: pt in chair  Transfers Overall transfer level: Needs assistance Equipment used: Rolling walker (2 wheeled) Transfers: Sit to/from Stand Sit to Stand: Min guard         General transfer comment: cues for technique.    Balance                                            ADL Overall ADL's : Needs assistance/impaired     Grooming: Wash/dry hands;Oral care;Brushing hair;Set up;Supervision/safety;Standing               Lower Body Dressing: Min guard;Sit to/from stand   Toilet Transfer: Min guard;Ambulation;RW;Comfort height toilet   Toileting- Clothing Manipulation and Hygiene: Min guard;Sit to/from stand       Functional mobility during ADLs: Min guard;Rolling walker General ADL Comments: Educated on BE FAST to pt and nephew and importance of getting help right away. Recommended sitting for most of bathing/dressing and safe shoewear. Pt  performed grooming at sink. Discussed that OT recommends SNF for continued rehab. As pt was grooming, OT explained some of the activities she was doing was good for coordination.     Vision                     Perception     Praxis      Pertinent Vitals/Pain No apparent distress.       Hand Dominance Right   Extremity/Trunk Assessment Upper Extremity Assessment Upper Extremity Assessment: RUE deficits/detail RUE Deficits / Details: Right elbow flexion slightly weaker than left RUE Coordination: decreased fine motor;decreased gross motor   Lower Extremity Assessment Lower Extremity Assessment: Defer to PT evaluation       Communication Communication Communication: Expressive difficulties   Cognition Arousal/Alertness: Awake/alert Behavior During Therapy: WFL for tasks assessed/performed Overall Cognitive Status: Within Functional Limits for tasks assessed                     General Comments       Exercises       Shoulder Instructions      Home Living Family/patient expects to be discharged to:: Private residence Living Arrangements: Alone Available Help at Discharge: Personal care attendant;Family;Available PRN/intermittently Type of Home: House Home Access: Stairs to enter [employer] of Steps: 1 Entrance Stairs-Rails: None Home Layout: One level  Bathroom Shower/Tub: Occupational psychologist: Handicapped height     Home Equipment: Environmental consultant - 2 wheels;Cane - single point;Shower seat;Bedside commode;Grab bars - toilet          Prior Functioning/Environment Level of Independence: Independent with assistive device(s);Needs assistance  Gait / Transfers Assistance Needed: Uses RW or cane for ambulation ADL's / Homemaking Assistance Needed: Aide 2 days/week for meal prep, groceries, laundry, and housekeeping.  Patient reports independent with bathing. Communication / Swallowing Assistance Needed: Expressive issues with  vocal cord paralysis      OT Diagnosis: Generalized weakness   OT Problem List: Decreased strength;Impaired balance (sitting and/or standing);Decreased knowledge of use of DME or AE;Decreased knowledge of precautions   OT Treatment/Interventions: Self-care/ADL training;DME and/or AE instruction;Therapeutic activities;Patient/family education;Balance training    OT Goals(Current goals can be found in the care plan section) Acute Rehab OT Goals Patient Stated Goal: not stated OT Goal Formulation: With patient Time For Goal Achievement: 01/10/14 Potential to Achieve Goals: Good ADL Goals Pt Will Perform Lower Body Dressing: with modified independence;sit to/from stand Pt Will Transfer to Toilet: with modified independence;ambulating;grab bars (comfort height) Pt Will Perform Toileting - Clothing Manipulation and hygiene: with modified independence;sit to/from stand Additional ADL Goal #1: Pt will independently perform HEP to increase right fine/gross motor coordination.  OT Frequency: Min 2X/week   Barriers to D/C: Decreased caregiver support          Co-evaluation              End of Session Equipment Utilized During Treatment: Gait belt;Rolling walker  Activity Tolerance: Patient tolerated treatment well Patient left: in chair;with call bell/phone within reach;with family/visitor present   Time: 1610-9604 OT Time Calculation (min): 38 min Charges:  OT General Charges $OT Visit: 1 Procedure OT Evaluation $Initial OT Evaluation Tier I: 1 Procedure OT Treatments $Self Care/Home Management : 8-22 mins G-Codes:    Benito Mccreedy OTR/L [phone] 01/03/2014, 12:36 PM

## 2014-01-03 NOTE — Evaluation (Signed)
Speech Language Pathology Evaluation Patient Details Name: Hailey Faulkner MRN: [id number] DOB: 1930/01/24 Today's Date: 01/03/2014 Time: 1856-3149 SLP Time Calculation (min): 34 min  Problem List:  Patient Active Problem List   Diagnosis Date Noted  . CVA (cerebral infarction) 01/02/2014  . Aspiration into airway 01/01/2014  . Encephalopathy acute 01/01/2014  . Vocal cord paralysis 01/01/2014  . history of Malignant gastrointestinal stromal tumor (GIST) of stomach 01/01/2014  . Seizures 01/01/2014  . Essential hypertension, benign 01/01/2014  . Meningioma, resected 01/01/2014  . Speech abnormality 01/01/2014  . Acute encephalopathy 01/01/2014  . Dyspnea 01/28/2011  . Hypercholesteremia 01/28/2011  . HBP (high blood pressure)   . NEOPLASM, BENIGN, STOMACH 12/08/2007  . NAUSEA ALONE 12/08/2007  . DYSPHAGIA UNSPECIFIED 12/08/2007  . GASTRITIS 06/27/2006   Past Medical History:  Past Medical History  Diagnosis Date  . History of UTI   . Pancreatitis   . HBP (high blood pressure)   . OP (osteoporosis)   . Vocal cord paralysis     RIGHT VOCAL CORD  . History of malignant gastrointestinal stromal tumor (GIST)   . Hypothyroidism   . Seizure disorder   . SIADH (syndrome of inappropriate ADH production)   . Melanoma     vulvar  . Meningioma   . Gait instability   . CVA (cerebral infarction)    Past Surgical History:  Past Surgical History  Procedure Laterality Date  . Thyroidectomy, partial  1970  . Cholecystectomy  10/2007  . Gastrectomy  2007  . Laryngoplasty     HPI:  80 yo female with mental status change, CVA versus seizure disorder as patient was found to be lethargic, postictal, with tongue laceration and had missed some doses of the Keppra due to travel.  Patient was placed on IV Keppra on admission. Pt found to have left thalamic cva per imaging study 5/24, CXR 5/24 negative - stable ATX. PMH + for meningioma s/p resection, vocal fold paralysis s/p  laryngoplasty, seizure.  OT reports pt coughing with water today and RN confirms.  Pt resides alone and nephew checks on her on occasion.     Assessment / Plan / Recommendation Clinical Impression  Pt presents with acute exacerbation of dysphonia on baseline vocal cord dysfunction (right paralysis) and mild dysarthria impacting her communication with others.  She also demonstrated decreased intellectual awareness to worsening speech/phonatory deficits - as nephew Hailey Faulkner noted.  [name] was oriented to person, place, situation and date but did not recall Memorial day holiday - stating "there are so many hoildays".  Language appeared intact without deficits but further diagnostic treatment indicated given pt lives alone and has left thalamic cva.    She would benefit from skilled SLP to maximize cognitive linguistic and swallowing skills.      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  Arousal/Alertness: Awake/alert Orientation Level: Oriented X4 Attention: Selective;Sustained Sustained Attention: Appears intact Selective Attention: Appears intact Memory: Appears intact Awareness: Impaired (pt denies awareness to voice and speech changes that nephew notes) Awareness Impairment: Intellectual impairment Problem Solving: Appears intact  Safety/Judgment: Appears intact (asking what to do if get into trouble eating, provided call bell and advised pt not to get into trouble eating)     Comprehension  Auditory Comprehension Overall Auditory Comprehension: Appears within functional limits for tasks assessed Yes/No Questions: Not tested Commands: Within Functional Limits Conversation: Complex Interfering Components: Hearing Visual Recognition/Discrimination Discrimination: Not tested Reading Comprehension Reading Status: Not tested    Expression Expression Primary Mode of Expression: Verbal Verbal Expression Overall Verbal Expression: Appears within functional limits for tasks assessed Initiation: No impairment Level of Generative/Spontaneous Verbalization: Conversation Repetition:  (dnt) Naming: Not tested Pragmatics: No impairment Written Expression Dominant Hand: Right Written Expression: Not tested   Oral / Motor Oral Motor/Sensory Function Overall Oral Motor/Sensory Function:  (decreased lingual movement, secretions retained in oral cavity, decreased right facial sensation - due to trigeminal nerve impact) Motor Speech Overall Motor Speech: Impaired Respiration: Impaired Level of Impairment: Word (pt able to phonate only 1-2 breathy words with each breath group, premorbid vocal cord paralysis) Phonation: Low vocal intensity;Breathy;Wet Resonance: Within functional limits Articulation: Impaired Level of Impairment: Phrase Intelligibility: Intelligible Motor Planning: Witnin functional limits Motor Speech Errors: Not applicable Interfering Components: Premorbid status;Anatomical limitations Effective Techniques: Increased vocal intensity;Slow rate   [employer], [place] [facility] [phone]

## 2014-01-04 ENCOUNTER — Inpatient Hospital Stay (HOSPITAL_COMMUNITY): Payer: Medicare HMO

## 2014-01-04 DIAGNOSIS — I6350 Cerebral infarction due to unspecified occlusion or stenosis of unspecified cerebral artery: Secondary | ICD-10-CM

## 2014-01-04 LAB — BASIC METABOLIC PANEL
BUN: 19 mg/dL (ref 6–23)
CALCIUM: 9.2 mg/dL (ref 8.4–10.5)
CO2: 23 mEq/L (ref 19–32)
CREATININE: 0.9 mg/dL (ref 0.50–1.10)
Chloride: 103 mEq/L (ref 96–112)
GFR, EST AFRICAN AMERICAN: 68 mL/min — AB (ref >90)
GFR, EST NON AFRICAN AMERICAN: 58 mL/min — AB (ref >90)
Glucose, Bld: 103 mg/dL — ABNORMAL HIGH (ref 70–99)
Potassium: 3.8 mEq/L (ref 3.7–5.3)
Sodium: 141 mEq/L (ref 137–147)

## 2014-01-04 MED ORDER — PANTOPRAZOLE SODIUM 40 MG PO TBEC
40.0000 mg | DELAYED_RELEASE_TABLET | Freq: Every day | ORAL | Status: DC
  Administered 2014-01-04 – 2014-01-05 (×2): 40 mg via ORAL
  Filled 2014-01-04 (×2): qty 1

## 2014-01-04 MED ORDER — STARCH (THICKENING) PO POWD
ORAL | Status: DC | PRN
  Filled 2014-01-04 (×2): qty 227

## 2014-01-04 MED ORDER — CLOPIDOGREL BISULFATE 75 MG PO TABS
75.0000 mg | ORAL_TABLET | Freq: Every day | ORAL | Status: DC
  Administered 2014-01-05 – 2014-01-06 (×2): 75 mg via ORAL
  Filled 2014-01-04 (×3): qty 1

## 2014-01-04 NOTE — Progress Notes (Signed)
Stroke Team Progress Note  HISTORY Hailey Faulkner is an 80 y.o. female with a history of meningioma resection in 1990 subsequent seizure disorder controlled with Keppra 750 mg daily. The patient's family brought her to the emergency department on 01/01/2014 for evaluation of lethargy and slurred speech. She had recently missed some doses of Keppra and it was noted that she had a laceration of her tongue. There was concern that the patient experienced a seizure that was not witnessed. The patient lives alone and apparently had stopped taking one or more for blood pressure medications recently as well. An initial CT of the head showed no acute changes; however, an MRI today revealed an acute infarct of the left thalamus. Most of the history was obtained from the patient's nephew who was in the room as well as the chart. The patient has a history of right vocal cord paralysis and she is dysarthric, which is new. She had not been on antiplatelet therapy prior to admission. Will need to start aspirin.   Patient was not administerd TPA secondary to being outside the window. She was admitted to [facility] further evaluation and treatment.  SUBJECTIVE Patient up in chair at bedside. Multiple family members present.   OBJECTIVE Most recent Vital Signs: Filed Vitals:   01/03/14 2102 01/04/14 0121 01/04/14 0500 01/04/14 0931  BP: 168/73 183/69 144/65 136/75  Pulse: 72 65 66 78  Temp: 98.2 F (36.8 C) 97.7 F (36.5 C) 97.5 F (36.4 C) 98 F (36.7 C)  TempSrc: Oral Oral Oral Oral  Resp: 20 18  20   Height:      Weight:      SpO2: 96% 97% 99% 98%   CBG (last 3)   Recent Labs  01/01/14 2016  GLUCAP 107*    IV Fluid Intake:     MEDICATIONS  . aspirin EC  81 mg Oral Daily  . enoxaparin (LOVENOX) injection  40 mg Subcutaneous Q24H  . levETIRAcetam  500 mg Oral Daily  . levETIRAcetam  250 mg Oral q morning - 10a  . levothyroxine  125 mcg Oral QAC breakfast  . metoprolol  10 mg Intravenous 3 times  per day  . pantoprazole  40 mg Oral QAC supper  . simvastatin  20 mg Oral q1800  . sodium chloride  3 mL Intravenous Q12H   PRN:  acetaminophen, acetaminophen, albuterol, food thickener, LORazepam, ondansetron (ZOFRAN) IV, ondansetron, senna-docusate  Diet:  Dysphagia 1 pudding thick liquids Activity: Up with assistance DVT Prophylaxis:  lovenox  CLINICALLY SIGNIFICANT STUDIES Basic Metabolic Panel:   Recent Labs Lab 01/01/14 1548 01/02/14 0920 01/03/14 0345 01/04/14 0526  NA 139 140 138 141  K 3.7 3.5* 3.8 3.8  CL 101 103 102 103  CO2 25 24 22 23   GLUCOSE 119* 104* 107* 103*  BUN 18 15 17 19   CREATININE 0.83 0.79 0.86 0.90  CALCIUM 8.8 8.6 9.1 9.2  MG  --  2.0  --   --    Liver Function Tests:   Recent Labs Lab 01/01/14 1548  AST 21  ALT 23  ALKPHOS 109  BILITOT 0.8  PROT 6.7  ALBUMIN 3.4*   CBC:   Recent Labs Lab 01/01/14 1548 01/02/14 0720 01/03/14 0345  WBC 13.2* 10.0 6.2  NEUTROABS 10.7* 7.3  --   HGB 11.4* 12.5 12.1  HCT 34.4* 36.4 36.9  MCV 91.0 91.7 91.3  PLT 171 157 166   Coagulation: No results found for this basename: LABPROT, INR,  in the  last 168 hours Cardiac Enzymes: No results found for this basename: CKTOTAL, CKMB, CKMBINDEX, TROPONINI,  in the last 168 hours Urinalysis:   Recent Labs Lab 01/01/14 1604  COLORURINE YELLOW  LABSPEC 1.019  PHURINE 6.0  GLUCOSEU NEGATIVE  HGBUR NEGATIVE  BILIRUBINUR NEGATIVE  KETONESUR NEGATIVE  PROTEINUR NEGATIVE  UROBILINOGEN 0.2  NITRITE NEGATIVE  LEUKOCYTESUR NEGATIVE   Lipid Panel    Component Value Date/Time   CHOL 231* 01/03/2014 0345   TRIG 162* 01/03/2014 0345   HDL 66 01/03/2014 0345   CHOLHDL 3.5 01/03/2014 0345   VLDL 32 01/03/2014 0345   LDLCALC 133* 01/03/2014 0345   HgbA1C  Lab Results  Component Value Date   HGBA1C 6.5* 01/03/2014    Urine Drug Screen:   No results found for this basename: labopia,  cocainscrnur,  labbenz,  amphetmu,  thcu,  labbarb    Alcohol Level: No  results found for this basename: ETH,  in the last 168 hours     CT of the brain  01/01/2014    No acute intracranial abnormality. Stable cerebral atrophy. Stable postsurgical changes and encephalomalacia in right frontal temporal lobe.     MRI of the brain  01/02/2014   Acute infarct left thalamus.  Stable postsurgical encephalomalacia right frontal lobe.  MRA of the brain  01/03/2014    Mild non-stenotic narrowing of the cavernous and supraclinoid ICA on the right, and mild non stenotic irregularity left P1 PCA. Moderately diseased A1 ACA on the right.  Otherwise, No flow-limiting intracranial stenosis or occlusion.   2D Echocardiogram  EF 55-60% with no source of embolus.   Carotid Doppler  No evidence of hemodynamically significant internal carotid artery stenosis. Vertebral artery flow is antegrade.   EEG   CXR   01/02/2014   Stable bibasilar atelectasis  01/01/2014   Hypoventilation with mild bibasilar atelectasis     Therapy Recommendations HH PT, SNF per OT  Physical Exam  Mental Status:  Alert, oriented, thought content appropriate. Speech dysarthric but fluent. Able to follow 3 step commands without difficulty.  Cranial Nerves:  II:  Visual fields grossly normal, pupils equal, round, reactive to light III,IV, VI: Slight right ptosis present, extra-ocular motions intact bilaterally  V,VII: Very mild right lower facial weakness, facial light touch sensation normal bilaterally  Motor:  Right : Upper extremity 4/5 Left: Upper extremity 5/5  Lower extremity 5/5 Lower extremity 5/5  Tone and bulk:normal tone throughout; no atrophy noted  Sensory: light touch intact throughout, bilaterally  Cerebellar:  Difficulty with finger to nose on the right secondary to weakness  ASSESSMENT Ms. Hailey Faulkner is a 80 y.o. female presenting with lethargy and dysarthria, found to have an acute infarct in the left thalamus.  Infarct felt to be thrombotic secondary to small vessel disease.   On no anticoagulatuion prior to admission. Now on ASA 81mg  daily for secondary stroke prevention. Patient with resultant right sided weakness, dysphagia. Stroke work up completed.  hypertension   Hx seizures Hyperlipidemia, LDL 133, on no statin PTA, now on zocor 20 , goal LDL < 100   Hx Meningioma,  with L hemicraniectomy 25 years ago  Hospital day # 3  TREATMENT/PLAN  Change aspirin to plavix for secondary stroke prevention  Consider increasing Keppra to 1000mg  daily. F/u EEG results.  Agree with plans for [facility] therapies  Follow up Dr. Erlinda Hong in 2 months   Burnetta Sabin, MSN, RN, ANVP-BC, ANP-BC, [facility] Pager: [phone] 01/04/2014 8:16 PM  I have personally obtained a history, examined the patient, evaluated imaging results, and formulated the assessment and plan of care. I agree with the above. Antony Contras, MD   To contact Stroke Continuity provider, please refer to [email or web address] After hours, contact General Neurology

## 2014-01-04 NOTE — ED Provider Notes (Signed)
I saw and evaluated the patient, reviewed the resident's note and I agree with the findings and plan.   EKG Interpretation   Date/Time:  Saturday Jan 01 2014 14:30:50 EDT Ventricular Rate:  84 PR Interval:  162 QRS Duration: 85 QT Interval:  401 QTC Calculation: 474 R Axis:   -10 Text Interpretation:  Sinus rhythm Atrial premature complex Baseline  wander in lead(s) II III aVF V2 V4 No significant change since last  tracing Confirmed by YAO  MD, DAVID ([id number]) on 01/01/2014 2:36:23 PM       Leota Jacobsen, MD 01/04/14 2004

## 2014-01-04 NOTE — Progress Notes (Signed)
Physical Therapy Treatment Patient Details Name: Hailey Faulkner MRN: [id number] DOB: 08-25-1929 Today's Date: 01/04/2014    History of Present Illness Patient is an 80 yo female admitted 01/01/14 due to lethargy and slurred speech.  Patient with h/o seizures, resected meningioma, vocal coard paralysis, HTN, GI tumor, melanoma, CVA, gait instability. MRI revealed acute infarct in left thalamus.    PT Comments    Progressing steadily.  Gait steady, but pt fatigues quickly.   Follow Up Recommendations  Home health PT;Supervision/Assistance - 24 hour     Equipment Recommendations  None recommended by PT    Recommendations for Other Services       Precautions / Restrictions Precautions Precautions: Fall Restrictions Weight Bearing Restrictions: No    Mobility  Bed Mobility Overal bed mobility: Needs Assistance Bed Mobility: Supine to Sit     Supine to sit: Min guard     General bed mobility comments: Use of rail, but no assist needed  Transfers Overall transfer level: Needs assistance Equipment used: Rolling walker (2 wheeled)   Sit to Stand: Min guard         General transfer comment: cues for technique.  Ambulation/Gait Ambulation/Gait assistance: Min guard Ambulation Distance (Feet): 250 Feet Assistive device: Rolling walker (2 wheeled) Gait Pattern/deviations: Step-through pattern Gait velocity: Decreased   General Gait Details: Generally steady, but needed cuing to use RW more safely   Stairs            Wheelchair Mobility    Modified Rankin (Stroke Patients Only)       Balance Overall balance assessment: Needs assistance Sitting-balance support: Feet supported;No upper extremity supported Sitting balance-Leahy Scale: Fair     Standing balance support: During functional activity;Bilateral upper extremity supported Standing balance-Leahy Scale: Poor                      Cognition Arousal/Alertness: Awake/alert Behavior  During Therapy: WFL for tasks assessed/performed Overall Cognitive Status: Within Functional Limits for tasks assessed                      Exercises      General Comments        Pertinent Vitals/Pain     Home Living                      Prior Function            PT Goals (current goals can now be found in the care plan section) Acute Rehab PT Goals Patient Stated Goal: not stated PT Goal Formulation: With patient Time For Goal Achievement: 01/09/14 Potential to Achieve Goals: Good Progress towards PT goals: Progressing toward goals    Frequency  Min 3X/week    PT Plan Current plan remains appropriate    Co-evaluation             End of Session   Activity Tolerance: Patient limited by fatigue Patient left: in chair;with call bell/phone within reach     Time: 1717-1737 PT Time Calculation (min): 20 min  Charges:  $Gait Training: 8-22 mins                    G Codes:      [employer] V 01/04/2014, 5:51 PM 01/04/2014  Donnella Sham, PT [phone]  (pager)

## 2014-01-04 NOTE — Procedures (Signed)
ELECTROENCEPHALOGRAM REPORT   Patient: Hailey Faulkner       Room #: 2N05 EEG No. ID: [id number] Age: 80 y.o.        Sex: female Referring Physician: Grandville Silos Report Date:  01/04/2014        Interpreting Physician: Alexis Goodell  History: Cinderella Christoffersen is an 80 y.o. female with a history of resection of right meningioma and seizures who now presents with lethargy.    Medications:  Scheduled: . clopidogrel  75 mg Oral Q breakfast  . enoxaparin (LOVENOX) injection  40 mg Subcutaneous Q24H  . levETIRAcetam  500 mg Oral Daily  . levETIRAcetam  250 mg Oral q morning - 10a  . levothyroxine  125 mcg Oral QAC breakfast  . metoprolol  10 mg Intravenous 3 times per day  . pantoprazole  40 mg Oral QAC supper  . simvastatin  20 mg Oral q1800  . sodium chloride  3 mL Intravenous Q12H    Conditions of Recording:  This is a 16 channel EEG carried out with the patient in the awake state.  Description:  The waking background activity is asymmetrical Over the left hemisphere is noted a low voltage, fairly well organized, 9 Hz alpha activity, seen from the parieto-occipital and posterior temporal regions.  Low voltage fast activity, poorly organized, is seen anteriorly and is at times superimposed on more posterior regions.  A mixture of theta and alpha rhythms are seen from the central and temporal regions. The patient drowses with slowing to irregular, low voltage theta and beta activity.  Over the right hemisphere is noted a breach rhythm with embedded, frequent frontotemporal to midtemporal sharp transients.   The patient does not drowse or sleep. Hyperventilation and intermittent photic stimulation were not performed.   IMPRESSION: This is an abnormal electroencephalogram due to a right breach rhythm with embedded sharp activity.  This is consistent with the patient's history of a right craniotomy and seizure disorder.  No electrographic seizures are noted.     Alexis Goodell, MD [facility] [phone] 01/04/2014, 10:48 PM

## 2014-01-04 NOTE — Clinical Social Work Psychosocial (Signed)
Clinical Social Work Department BRIEF PSYCHOSOCIAL ASSESSMENT 01/04/2014  Patient:  Hailey Faulkner, Hailey Faulkner     Account Number:  [id number]     Admit date:  01/01/2014  Clinical Social Worker:  Donna Christen  Date/Time:  01/04/2014 11:32 AM  Referred by:  Physician  Date Referred:  01/04/2014 Referred for  SNF Placement   Other Referral:   none.   Interview type:  Patient Other interview type:   CSW also spoke with MD regarding medical stability.    PSYCHOSOCIAL DATA Living Status:  ALONE Admitted from facility:   Level of care:   Primary support name:  Purvis Sheffield Primary support relationship to patient:  FAMILY Degree of support available:   Pt's nephew. Pt stated that she lives alone and does not have children. Pt informed CSW pt's nephew is her primary support system.    CURRENT CONCERNS Current Concerns  Post-Acute Placement   Other Concerns:   none.    SOCIAL WORK ASSESSMENT / PLAN CSW received consult for SNF placement at time of discharge. CSW met with pt at bedside to discuss discharge disposition. Pt stated she lives alone. Pt stated uncertainty regarding SNF placement, but agreeable to CSW completing SNF search in [facility]. CSW continue to follow and assist with discharge planning needs.   Assessment/plan status:  Psychosocial Support/Ongoing Assessment of Needs Other assessment/ plan:   none.   Information/referral to community resources:   [facility] placement.    PATIENTS/FAMILYS RESPONSE TO PLAN OF CARE: Pt stated uncertainity regarding SNF placement at time of discharge. Per pt's chart, pt previously refusing SNF placement. Pt agreeable to CSW completing SNF search in [facility]. CSW offered support to pt.       Pati Gallo, [place] Social Worker [phone]

## 2014-01-04 NOTE — Procedures (Signed)
Objective Swallowing Evaluation: Modified Barium Swallowing Study  Patient Details  Name: Hailey Faulkner MRN: [id number] Date of Birth: [birth year]  Today's Date: 01/04/2014 Time: 1030-1050 SLP Time Calculation (min): 20 min  Past Medical History:  Past Medical History  Diagnosis Date  . History of UTI   . Pancreatitis   . HBP (high blood pressure)   . OP (osteoporosis)   . Vocal cord paralysis     RIGHT VOCAL CORD  . History of malignant gastrointestinal stromal tumor (GIST)   . Hypothyroidism   . Seizure disorder   . SIADH (syndrome of inappropriate ADH production)   . Melanoma     vulvar  . Meningioma   . Gait instability   . CVA (cerebral infarction)    Past Surgical History:  Past Surgical History  Procedure Laterality Date  . Thyroidectomy, partial  1970  . Cholecystectomy  10/2007  . Gastrectomy  2007  . Laryngoplasty     HPI:  80 yo female with mental status change, CVA versus seizure disorder as patient was found to be lethargic, postictal, with tongue laceration and had missed some doses of the Keppra due to travel.  Patient was placed on IV Keppra on admission. Pt found to have left thalamic cva per imaging study 5/24, CXR 5/24 negative - stable ATX. PMH + for meningioma s/p resection, vocal fold paralysis s/p laryngoplasty, seizure.  OT reports pt coughing with water today and RN confirms.  Pt resides alone and nephew checks on her on occasion.  BSE recommended MBS.       Assessment / Plan / Recommendation Clinical Impression  Dysphagia Diagnosis: Mild oral phase dysphagia;Moderate oral phase dysphagia;Moderate pharyngeal phase dysphagia;Severe pharyngeal phase dysphagia Clinical impression: Pt. exhibited mild-moderate oral dysphagia marked by consistent lingual residue with intermittent awareness.  Moderate-severe sensorimotor based pharyngeal dysphagia indicated by decreased sensation, reduced tongue base retraction and decreased laryngeal elevation leading to  primarily silent laryngeal aspiration/penetration during initial swallow as well as from pharyngeal residue.  A right head turn (toward paralyzed vocal cord) and chin tuck (during separate trial) were both ineffective.  Pt. unable to effectively clear/reduce penetrates with ineffective cough/throat clear.  Unfortunately she is at high aspiration risk with any consistency thinner than puree at present.  SLP recommends Dys 1 only, pudding thick liquids, crush pills, swallow 2 times and full assist.  ST will continue to follow.     Treatment Recommendation  Therapy as outlined in treatment plan below    Diet Recommendation Dysphagia 1 (Puree);Pudding-thick liquid   Liquid Administration via: Spoon Medication Administration: Crushed with puree Supervision: Patient able to self feed;Full supervision/cueing for compensatory strategies Compensations: Slow rate;Small sips/bites;Multiple dry swallows after each bite/sip;Clear throat intermittently Postural Changes and/or Swallow Maneuvers: Seated upright 90 degrees;Upright 30-60 min after meal    Other  Recommendations Oral Care Recommendations: Oral care BID Other Recommendations: Order thickener from pharmacy   Follow Up Recommendations  Skilled Nursing facility    Frequency and Duration min 2x/week  2 weeks   Pertinent Vitals/Pain WDL          Reason for Referral Objectively evaluate swallowing function   Oral Phase Oral Preparation/Oral Phase Oral Phase: Impaired Oral - Honey Oral - Honey Teaspoon: Lingual/palatal residue;Weak lingual manipulation Oral - Honey Cup: Lingual/palatal residue;Weak lingual manipulation Oral - Nectar Oral - Nectar Teaspoon: Lingual/palatal residue;Weak lingual manipulation Oral - Nectar Cup: Lingual/palatal residue;Weak lingual manipulation Oral - Solids Oral - Puree: Lingual/palatal residue;Weak lingual manipulation   Pharyngeal Phase Pharyngeal  Phase Pharyngeal Phase: Impaired Pharyngeal -  Honey Pharyngeal - Honey Teaspoon: Penetration/Aspiration during swallow;Pharyngeal residue - valleculae;Pharyngeal residue - pyriform sinuses;Reduced laryngeal elevation;Reduced tongue base retraction;Delayed swallow initiation;Premature spillage to pyriform sinuses Penetration/Aspiration details (honey teaspoon): Material enters airway, passes BELOW cords without attempt by patient to eject out (silent aspiration) Pharyngeal - Honey Cup: Penetration/Aspiration during swallow;Pharyngeal residue - valleculae;Pharyngeal residue - pyriform sinuses;Reduced tongue base retraction;Reduced laryngeal elevation;Delayed swallow initiation;Premature spillage to pyriform sinuses Penetration/Aspiration details (honey cup): Material enters airway, passes BELOW cords without attempt by patient to eject out (silent aspiration);Material enters airway, passes BELOW cords and not ejected out despite cough attempt by patient Pharyngeal - Nectar Pharyngeal - Nectar Teaspoon: Penetration/Aspiration during swallow;Pharyngeal residue - valleculae;Pharyngeal residue - pyriform sinuses;Reduced laryngeal elevation;Reduced tongue base retraction;Reduced airway/laryngeal closure (attempted right head turn (ineffective)) Penetration/Aspiration details (nectar teaspoon): Material enters airway, passes BELOW cords without attempt by patient to eject out (silent aspiration) Pharyngeal - Nectar Cup: Not tested Pharyngeal - Solids Pharyngeal - Puree: Delayed swallow initiation;Premature spillage to valleculae;Pharyngeal residue - valleculae;Reduced tongue base retraction  Cervical Esophageal Phase    GO    Cervical Esophageal Phase Cervical Esophageal Phase: Darryll Capers         Cranford [employer] [phone]  01/04/2014

## 2014-01-04 NOTE — Progress Notes (Signed)
CARE MANAGEMENT NOTE 01/04/2014  Patient:  Hailey Faulkner, Hailey Faulkner   Account Number:  [id number]  Date Initiated:  01/04/2014  Documentation initiated by:  Olga Coaster  Subjective/Objective Assessment:   ADMITTED WITH STROKE     Action/Plan:   CM FOLLOWING FOR DCP   Anticipated DC Date:  01/08/2014   Anticipated DC Plan:  [place]  CM consult          Status of service:  In process, will continue to follow Medicare Important Message given?   (If response is "NO", the following Medicare IM given date fields will be blank)  Per UR Regulation:  Reviewed for med. necessity/level of care/duration of stay  Comments:  5/26/2015Mindi Slicker RN,BSN,MHA [phone]

## 2014-01-04 NOTE — Progress Notes (Signed)
Routine adult EEG completed, results pending. 

## 2014-01-04 NOTE — Progress Notes (Signed)
[facility] PROGRESS NOTE  Hailey Faulkner OFB:[id number] DOB: 1930/02/10 DOA: 01/01/2014 PCP: Criselda Peaches, MD  Assessment/Plan: #1 acute encephalopathy Likely secondary to acute CVA +/- seizure disorder as patient was found to be lethargic, postictal, with tongue laceration and had missed some doses of the Keppra due to travel. Patient was placed on Keppra on admission. Patient currently alert oriented x3 and following commands. No seizures noted overnight per nursing. CT of the head is negative. MRI of the head is consistent with acute thalamic stroke. EEG is pending. Carotid dopplers pending. 2 d echo with no source of emboli. Repeat chest x-ray negative for any acute infiltrate. Continue  Keppra for now. Neurology following and appreciate input and recommendations.   #2 acute thalamic stroke Questionable etiology. Patient was not on aspirin prior to admission. 2-D echo with no source of emboli. Cariotid Dopplers pending, started on a statin. Continue aspirin for secondary stroke prevention. Neurology following.  #2 history of seizure disorder See problem #1. EEG pending. Patient was noted to have missed doses of Keppra secondary to travel. Patient has been placed on Keppra for now. Neurology following.   #3 hypertension BP improved. Continue IV Lopressor 10mg  3 times a day.  #4 Dysphagia Patient s/p MBS results pending. Continue current diet.  #5 Prophylaxis Lovenox for DVT prophylaxis  Code Status: Full Family Communication: Updated patient no family at bedside. Disposition Plan: Home with Media vs SNF when medically stable.   Consultants:  Neurology: Dr. Aram Beecham 01/02/2014  Procedures:  CT head 01/01/2014  Chest x-ray 01/01/2014, 01/02/2014  MRI head 01/02/2014  EEG pending  2 d echo 01/03/14  Carotid dopplers pending  Antibiotics:  None  HPI/Subjective: Patient with no complaints. No noted seizures overnight per nursing. Patient in bed, hesitant to  go to SNF.  Objective: Filed Vitals:   01/04/14 0931  BP: 136/75  Pulse: 78  Temp: 98 F (36.7 C)  Resp: 20    Intake/Output Summary (Last 24 hours) at 01/04/14 1128 Last data filed at 01/03/14 1852  Gross per 24 hour  Intake    260 ml  Output      0 ml  Net    260 ml   Filed Weights   01/01/14 2141 01/02/14 2007  Weight: 82.056 kg (180 lb 14.4 oz) 77.565 kg (171 lb)    Exam:   General:  NAD. Patient with dysarthric speech and a soft voice.  Cardiovascular: RRR  Respiratory: CTAB  Abdomen: Soft/NT/ND/+BS  Musculoskeletal: No c/c/e  Data Reviewed: Basic Metabolic Panel:  Recent Labs Lab 01/01/14 1548 01/02/14 0920 01/03/14 0345 01/04/14 0526  NA 139 140 138 141  K 3.7 3.5* 3.8 3.8  CL 101 103 102 103  CO2 25 24 22 23   GLUCOSE 119* 104* 107* 103*  BUN 18 15 17 19   CREATININE 0.83 0.79 0.86 0.90  CALCIUM 8.8 8.6 9.1 9.2  MG  --  2.0  --   --    Liver Function Tests:  Recent Labs Lab 01/01/14 1548  AST 21  ALT 23  ALKPHOS 109  BILITOT 0.8  PROT 6.7  ALBUMIN 3.4*   No results found for this basename: LIPASE, AMYLASE,  in the last 168 hours No results found for this basename: AMMONIA,  in the last 168 hours CBC:  Recent Labs Lab 01/01/14 1548 01/02/14 0720 01/03/14 0345  WBC 13.2* 10.0 6.2  NEUTROABS 10.7* 7.3  --   HGB 11.4* 12.5 12.1  HCT 34.4* 36.4 36.9  MCV  91.0 91.7 91.3  PLT 171 157 166   Cardiac Enzymes: No results found for this basename: CKTOTAL, CKMB, CKMBINDEX, TROPONINI,  in the last 168 hours BNP (last 3 results) No results found for this basename: PROBNP,  in the last 8760 hours CBG:  Recent Labs Lab 01/01/14 2016  GLUCAP 107*    Recent Results (from the past 240 hour(s))  URINE CULTURE     Status: None   Collection Time    01/01/14  4:04 PM      Result Value Ref Range Status   Specimen Description URINE, CATHETERIZED   Final   Special Requests NONE   Final   Culture  Setup Time     Final   Value:  01/01/2014 21:20     Performed at [place]     Final   Value: NO GROWTH     Performed at [facility]   Culture     Final   Value: NO GROWTH     Performed at [facility]   Report Status 01/02/2014 FINAL   Final     Studies: Mr Brain Wo Contrast  01/02/2014   CLINICAL DATA:  Dysarthria.  History of meningioma resection.  EXAM: MRI HEAD WITHOUT CONTRAST  TECHNIQUE: Multiplanar, multiecho pulse sequences of the brain and surrounding structures were obtained without intravenous contrast.  COMPARISON:  CT head 01/01/2014  FINDINGS: Acute infarct in the left lateral thalamus measuring approximately 10 x 12 mm. No other acute infarct identified.  Right frontal craniectomy for tumor resection. Large area of encephalomalacia in the right frontal lobe is stable. Right frontal horn is enlarged. Ventricles are diffusely dilated but stable from prior studies.  Image quality degraded by motion.  Negative for hemorrhage or mass.  IMPRESSION: Acute infarct left thalamus.  Stable postsurgical encephalomalacia right frontal lobe.   Electronically Signed   By: Franchot Gallo M.D.   On: 01/02/2014 12:52   Mr Jodene Nam Head/brain Wo Cm  01/03/2014   CLINICAL DATA:  Followup cerebral vascular accident.  EXAM: MRA HEAD WITHOUT CONTRAST  TECHNIQUE: Angiographic images of the Circle of Willis were obtained using MRA technique without intravenous contrast.  COMPARISON:  MRI brain 01/02/2014.  FINDINGS: The left internal carotid artery is widely patent. There is mild non stenotic narrowing at the cavernous and supraclinoid ICA on the right. No skull base ICA lesions.  Basilar artery widely patent with vertebrals codominant.  Moderately diseased A1 ACA on the right. Both anterior cerebral is distally appear well supplied from the left ACA. No MCA stenosis or occlusion. No MCA branch occlusion. The mild non stenotic irregularity P1 PCA on the left. Mild irregularity both distal PCAs. No  cerebellar branch occlusion. No intracranial aneurysm.  IMPRESSION: Mild non-stenotic narrowing of the cavernous and supraclinoid ICA on the right, and mild non stenotic irregularity left P1 PCA. Moderately diseased A1 ACA on the right.  Otherwise, No flow-limiting intracranial stenosis or occlusion.   Electronically Signed   By: Rolla Flatten M.D.   On: 01/03/2014 16:02    Scheduled Meds: . aspirin EC  81 mg Oral Daily  . enoxaparin (LOVENOX) injection  40 mg Subcutaneous Q24H  . levETIRAcetam  500 mg Oral Daily  . levETIRAcetam  250 mg Oral q morning - 10a  . levothyroxine  125 mcg Oral QAC breakfast  . metoprolol  10 mg Intravenous 3 times per day  . pantoprazole  40 mg Oral QAC supper  . simvastatin  20 mg Oral q1800  . sodium chloride  3 mL Intravenous Q12H   Continuous Infusions:   Principal Problem:   Acute encephalopathy Active Problems:   Seizures   CVA (cerebral infarction)   NEOPLASM, BENIGN, STOMACH   Vocal cord paralysis   history of Malignant gastrointestinal stromal tumor (GIST) of stomach   Essential hypertension, benign   Meningioma, resected   Speech abnormality    Time spent: [place] MD [facility] Pager [phone]. If 7PM-7AM, please contact night-coverage at [email or web address], password [email or web address] 01/04/2014, 11:28 AM  LOS: 3 days

## 2014-01-04 NOTE — Progress Notes (Signed)
Bilateral carotid artery duplex:  1-39% ICA stenosis.  Vertebral artery flow is antegrade.     

## 2014-01-04 NOTE — Progress Notes (Signed)
Palliative Medicine Team  Spoke with patient's nephew and his wife by phone. They have HCPOA and report that thsi patient has advance directives and had statted previously that if she had another seizure or stroke she would want to not be brought to teh hospital and allowed to die at home just prior to the events that occurred. Her family members tell me that they only brought her to the ER for comfort care, they brought advance directive with them but according to them were told by ER staff "we aren't quite there yet" when they brought them to their attention-her family asked why they were sticking her with needles and scanning her when her goals were for comfort but they were told it was too early for palliative or for comfort care. She was admitted on 5/23. She has now been stabilized post stroke. Her nephew repots that they have approval for her and bed reserved for her at [facility]. I provided education about hospice and palliative care over the phone this evening. Her nephew tells me that he can meet a palliative provider at 10AM to get advanced directives filed into out system- he will bring copies. I have obtained consent for DNR tonight. Recommend strongly that they have a palliative consultation at [facility].  Lane Hacker, DO Palliative Medicine

## 2014-01-04 NOTE — Clinical Social Work Placement (Addendum)
Clinical Social Work Department CLINICAL SOCIAL WORK PLACEMENT NOTE 01/04/2014  Patient:  Hailey Faulkner, Hailey Faulkner  Account Number:  [id number] Admit date:  01/01/2014  Clinical Social Worker:  Raquel Sarna [place], LCSWA  Date/time:  01/04/2014 11:43 AM  Clinical Social Work is seeking post-discharge placement for this patient at the following level of care:   [place]   (*CSW will update this form in Epic as items are completed)   01/04/2014  Patient/family provided with [place] Department of Clinical Social Works list of facilities offering this level of care within the geographic area requested by the patient (or if unable, by the patients family).  01/04/2014  Patient/family informed of their freedom to choose among providers that offer the needed level of care, that participate in Medicare, Medicaid or managed care program needed by the patient, have an available bed and are willing to accept the patient.  01/04/2014  Patient/family informed of [facility] ownership interest in [facility], as well as of the fact that they are under no obligation to receive care at this facility.  PASARR submitted to EDS on 01/04/2014 PASARR number received from EDS on 01/04/2014  FL2 transmitted to all facilities in geographic area requested by pt/family on  01/04/2014 FL2 transmitted to all facilities within larger geographic area on   Patient informed that his/her managed care company has contracts with or will negotiate with  certain facilities, including the following:     Patient/family informed of bed offers received:  01/05/2014 Patient chooses bed at  [facility] [facility] Physician recommends and patient chooses bed at    Patient to be transferred to  [place] [facility] on  01/06/2014 Patient to be transferred to facility by [facility]  The following physician request were entered in Epic:   Additional Comments:  Pati Gallo, [place]  Worker [phone]

## 2014-01-05 DIAGNOSIS — J690 Pneumonitis due to inhalation of food and vomit: Secondary | ICD-10-CM

## 2014-01-05 DIAGNOSIS — I6350 Cerebral infarction due to unspecified occlusion or stenosis of unspecified cerebral artery: Secondary | ICD-10-CM

## 2014-01-05 LAB — BASIC METABOLIC PANEL
BUN: 19 mg/dL (ref 6–23)
CALCIUM: 9.5 mg/dL (ref 8.4–10.5)
CO2: 22 meq/L (ref 19–32)
Chloride: 102 mEq/L (ref 96–112)
Creatinine, Ser: 0.92 mg/dL (ref 0.50–1.10)
GFR calc Af Amer: 66 mL/min — ABNORMAL LOW (ref >90)
GFR calc non Af Amer: 57 mL/min — ABNORMAL LOW (ref >90)
Glucose, Bld: 108 mg/dL — ABNORMAL HIGH (ref 70–99)
POTASSIUM: 3.8 meq/L (ref 3.7–5.3)
SODIUM: 140 meq/L (ref 137–147)

## 2014-01-05 MED ORDER — POLYETHYLENE GLYCOL 3350 17 G PO PACK
17.0000 g | PACK | Freq: Every day | ORAL | Status: DC
  Administered 2014-01-05 – 2014-01-06 (×2): 17 g via ORAL
  Filled 2014-01-05 (×2): qty 1

## 2014-01-05 MED ORDER — LEVETIRACETAM ER 750 MG PO TB24: 750.0000 mg | ORAL_TABLET | Freq: Every day | ORAL | Status: DC

## 2014-01-05 MED ORDER — LEVETIRACETAM ER 500 MG PO TB24: 500.0000 mg | ORAL_TABLET | Freq: Every day | ORAL | Status: DC

## 2014-01-05 MED ORDER — LEVETIRACETAM 500 MG PO TABS
500.0000 mg | ORAL_TABLET | Freq: Two times a day (BID) | ORAL | Status: DC
  Administered 2014-01-05: 500 mg via ORAL
  Filled 2014-01-05 (×2): qty 1

## 2014-01-05 MED ORDER — LEVOTHYROXINE SODIUM 125 MCG PO TABS: 125.0000 ug | ORAL_TABLET | Freq: Every day | ORAL | Status: DC

## 2014-01-05 MED ORDER — PANTOPRAZOLE SODIUM 40 MG PO PACK
40.0000 mg | PACK | Freq: Every day | ORAL | Status: DC
  Administered 2014-01-06: 40 mg via ORAL
  Filled 2014-01-05 (×3): qty 20

## 2014-01-05 MED ORDER — LEVETIRACETAM 250 MG PO TABS
250.0000 mg | ORAL_TABLET | Freq: Every day | ORAL | Status: DC
  Administered 2014-01-06: 250 mg via ORAL
  Filled 2014-01-05: qty 1

## 2014-01-05 MED ORDER — LOSARTAN POTASSIUM 50 MG PO TABS
100.0000 mg | ORAL_TABLET | Freq: Every day | ORAL | Status: DC
  Administered 2014-01-05 – 2014-01-06 (×2): 100 mg via ORAL
  Filled 2014-01-05 (×2): qty 2

## 2014-01-05 MED ORDER — MECLIZINE HCL 12.5 MG PO TABS
12.5000 mg | ORAL_TABLET | Freq: Three times a day (TID) | ORAL | Status: DC | PRN
  Filled 2014-01-05: qty 1

## 2014-01-05 MED ORDER — LEVETIRACETAM 250 MG PO TABS
250.0000 mg | ORAL_TABLET | Freq: Once | ORAL | Status: AC
  Administered 2014-01-05: 250 mg via ORAL
  Filled 2014-01-05: qty 1

## 2014-01-05 MED ORDER — DOXAZOSIN MESYLATE 1 MG PO TABS
1.0000 mg | ORAL_TABLET | Freq: Every day | ORAL | Status: DC
  Administered 2014-01-05: 1 mg via ORAL
  Filled 2014-01-05 (×2): qty 1

## 2014-01-05 MED ORDER — ATENOLOL 100 MG PO TABS
100.0000 mg | ORAL_TABLET | Freq: Every day | ORAL | Status: DC
  Administered 2014-01-05 – 2014-01-06 (×2): 100 mg via ORAL
  Filled 2014-01-05 (×2): qty 1

## 2014-01-05 NOTE — Consult Note (Signed)
Patient ID:Hailey Faulkner      DOB: 1929/10/26      WFU:[id number]     Consult Note from the Palliative Medicine Team at [place] Requested by: Dr. Grandville Silos     PCP: Criselda Peaches, MD Reason for Consultation: [place] and options.     Phone [phone]  Assessment of patients Current state:  Hailey Faulkner is a 80 yo female with new acute infarct of left thalamus. She had also missed doses of seizure medications prior to admission and according to her nephew and his wife had also not been taking her BP medicines as prescribed for reasons they did not know. PMH significant for right vocal cord paralysis, seizure disorder, meningioma, malignant gastrointestinal stromal tumor (GIST), hypothyroidism, CVA, HTN. She now has dysphagia with recommended diet dysphagia I with pudding thick fluids.   I have met with Hailey Faulkner and her nephew, [name] [facility]). She is very clear about her wishes and we completed MOST form: DNR, comfort measures (no return to the hospital), no antibiotics, no IV fluids and no feeding tube. She is very clear that she does not wish to come back to the hospital and have IVs, labs, testing, etc but wishes to remain at homeShe tells me she doesn't want anything to prolong her life and that she is tired and has been through a lot. She says that she wants to be comfortable and not to suffer. She plans on [employer] short term to see if she can regain some independence while her family sets up care for home - home is the ultimate goal. They wish for palliative to follow at [facility] and plan for home with hospice to help achieve her goals of comfort and to stay home.   Goals of Care: 1.  Code Status: DNR   2. Scope of Treatment: She is wishing for comfort care and looking forward to discharge from the hospital. She does not wish for further lab draws, IVs, IV fluids.    4. Disposition: SNF rehab with palliative to follow.    3. Symptom  Management:   1. Anxiety/Agitation: Lorazepam prn.  2. Bowel Regimen: Miralax and Senokot-S daily.  3. Weakness: Continue PT. SNF rehab.   4. Psychosocial: Emotional support provided to patient and family.    Patient Documents Completed or Given: Document Given Completed  Advanced Directives Pkt    MOST    DNR    Gone from My Sight    Hard Choices      Brief HPI: 80 yo female with new stroke. She wishes to focus on comfort care with no readmission.    ROS: Denies pain, nausea, dyspnea, constipation.     PMH:  Past Medical History  Diagnosis Date  . History of UTI   . Pancreatitis   . HBP (high blood pressure)   . OP (osteoporosis)   . Vocal cord paralysis     RIGHT VOCAL CORD  . History of malignant gastrointestinal stromal tumor (GIST)   . Hypothyroidism   . Seizure disorder   . SIADH (syndrome of inappropriate ADH production)   . Melanoma     vulvar  . Meningioma   . Gait instability   . CVA (cerebral infarction)      PSH: Past Surgical History  Procedure Laterality Date  . Thyroidectomy, partial  1970  . Cholecystectomy  10/2007  . Gastrectomy  2007  . Laryngoplasty     I have reviewed the [place]  and SH and  If appropriate update it with new information. Allergies  Allergen Reactions  . Hctz [Hydrochlorothiazide]   . Hydrocodone    Scheduled Meds: . atenolol  100 mg Oral Daily  . clopidogrel  75 mg Oral Q breakfast  . doxazosin  1 mg Oral QHS  . enoxaparin (LOVENOX) injection  40 mg Subcutaneous Q24H  . [START ON 01/06/2014] levETIRAcetam  250 mg Oral Daily  . levothyroxine  125 mcg Oral QAC breakfast  . losartan  100 mg Oral Daily  . pantoprazole  40 mg Oral QAC supper  . polyethylene glycol  17 g Oral Daily  . simvastatin  20 mg Oral q1800  . sodium chloride  3 mL Intravenous Q12H   Continuous Infusions:  PRN Meds:.acetaminophen, acetaminophen, albuterol, food thickener, LORazepam, meclizine, ondansetron (ZOFRAN) IV, ondansetron,  senna-docusate    BP 150/63  Pulse 83  Temp(Src) 97.9 F (36.6 C) (Oral)  Resp 18  Ht 5\' 5"  (1.651 m)  Wt 77.565 kg (171 lb)  BMI 28.46 kg/m2  SpO2 98%   PPS: 40%   Intake/Output Summary (Last 24 hours) at 01/05/14 1748 Last data filed at 01/05/14 1333  Gross per 24 hour  Intake    243 ml  Output      0 ml  Net    243 ml   LBM: 01/02/14                         Physical Exam:  General:  NAD, pleasant, frail HEENT:  [place]/AT, moist mucous membranes, dysarthric speech, no JVD Chest: CTA throughout, no labored breathing, symmetric CVS: RRR, S1 S2 Abdomen: Soft, NT, ND, +BS Ext: MAE, no edema, warm to touch Neuro: Alert, oriented x 3, follows commands  Labs: CBC    Component Value Date/Time   WBC 6.2 01/03/2014 0345   WBC 5.9 07/07/2013 1445   RBC 4.04 01/03/2014 0345   RBC 3.81 07/07/2013 1445   HGB 12.1 01/03/2014 0345   HCT 36.9 01/03/2014 0345   PLT 166 01/03/2014 0345   MCV 91.3 01/03/2014 0345   MCH 30.0 01/03/2014 0345   MCH 32.3 07/07/2013 1445   MCHC 32.8 01/03/2014 0345   MCHC 34.5 07/07/2013 1445   RDW 13.1 01/03/2014 0345   RDW 13.7 07/07/2013 1445   LYMPHSABS 1.8 01/02/2014 0720   MONOABS 0.8 01/02/2014 0720   EOSABS 0.2 01/02/2014 0720   BASOSABS 0.0 01/02/2014 0720    BMET    Component Value Date/Time   NA 140 01/05/2014 0436   NA 137 07/07/2013 1445   K 3.8 01/05/2014 0436   CL 102 01/05/2014 0436   CO2 22 01/05/2014 0436   GLUCOSE 108* 01/05/2014 0436   GLUCOSE 128* 07/07/2013 1445   BUN 19 01/05/2014 0436   BUN 12 07/07/2013 1445   CREATININE 0.92 01/05/2014 0436   CALCIUM 9.5 01/05/2014 0436   GFRNONAA 57* 01/05/2014 0436   GFRAA 66* 01/05/2014 0436    CMP     Component Value Date/Time   NA 140 01/05/2014 0436   NA 137 07/07/2013 1445   K 3.8 01/05/2014 0436   CL 102 01/05/2014 0436   CO2 22 01/05/2014 0436   GLUCOSE 108* 01/05/2014 0436   GLUCOSE 128* 07/07/2013 1445   BUN 19 01/05/2014 0436   BUN 12 07/07/2013 1445   CREATININE 0.92 01/05/2014  0436   CALCIUM 9.5 01/05/2014 0436   PROT 6.7 01/01/2014 1548   PROT 6.5 07/07/2013 1445  ALBUMIN 3.4* 01/01/2014 1548   AST 21 01/01/2014 1548   ALT 23 01/01/2014 1548   ALKPHOS 109 01/01/2014 1548   BILITOT 0.8 01/01/2014 1548   GFRNONAA 57* 01/05/2014 0436   GFRAA 66* 01/05/2014 0436     Time In Time Out Total Time Spent with Patient Total Overall Time  1030 1230 21min 13min    Greater than 50%  of this time was spent counseling and coordinating care related to the above assessment and plan.  Vinie Sill, NP Palliative Medicine Team Pager # [phone] (M-F 8a-5p) Team Phone # [phone] (Nights/Weekends)

## 2014-01-05 NOTE — Progress Notes (Signed)
Occupational Therapy Treatment Patient Details Name: Hailey Faulkner MRN: [id number] DOB: 05-Nov-1929 Today's Date: 01/05/2014    History of present illness Patient is an 80 yo female admitted 01/01/14 due to lethargy and slurred speech.  Patient with h/o seizures, resected meningioma, vocal coard paralysis, HTN, GI tumor, melanoma, CVA, gait instability. MRI revealed acute infarct in left thalamus.   OT comments  Pt seen today for ADLs. Pt reports she is using R, dominant hand for more activities including practicing writing. Educated pt and nephew on safety with ADLs. Pt is talkative this date and required verbal cues to speak slowly and clearly, however she told multiple stories.   Follow Up Recommendations  SNF;Supervision/Assistance - 24 hour    Equipment Recommendations  None recommended by OT       Precautions / Restrictions Precautions Precautions: Fall Restrictions Weight Bearing Restrictions: No       Mobility Bed Mobility               General bed mobility comments: Not assessed- pt sitting in recliner for duration of OT session.  Transfers                 General transfer comment: Pt sitting in recliner for duration and declined OOB activities.        ADL Overall ADL's : Needs assistance/impaired     Grooming: Brushing hair;Oral care;Wash/dry hands;Set up;Supervision/safety;Sitting                                 General ADL Comments: Pt was sitting in recliner when OT arrived and declined getting OOB due to comfort and fatigue. Educated pt on safety with ADLs and home and encouraged use of RUE in grooming activities. Pt also practiced writing with R, dominant hand. Encouarged pt to speak slowly and loudly for clarity. Pt told multiple stories and was talkative despite needing verbal cues to speak clearly.                 Cognition  Arousal/Alertness: Awake/Alert Behavior During Therapy: WFL for tasks  assessed/performed Overall Cognitive Status: Within Functional Limits for tasks assessed                                    Pertinent Vitals/ Pain       No c/o pain         Frequency Min 2X/week     Progress Toward Goals  OT Goals(current goals can now be found in the care plan section)  Progress towards OT goals: Progressing toward goals     Plan Discharge plan remains appropriate       End of Session    Activity Tolerance Patient tolerated treatment well   Patient Left in chair;with call bell/phone within reach;with family/visitor present (Nephew in room)           Time: 1751-0258 OT Time Calculation (min): 31 min  Charges: OT General Charges $OT Visit: 1 Procedure OT Treatments $Self Care/Home Management : 23-37 mins  Juluis Rainier [phone] 01/05/2014, 1:51 PM

## 2014-01-05 NOTE — Clinical Social Work Note (Signed)
CSW presented SNF bed offers to pt's nephew, Purvis Sheffield. Per pt's nephew, pt and pt's family only agreeable to placement at [facility]. Pt initially denied bed offer from [facility]. CSW followed-up with admissions liaison from [facility] regarding possible admission. Per [facility] admissions liaison, facility is NOT contracted with pt's health insurance. CSW spoke with pt's nephew regarding information above. Per pt's nephew, pt is unable to discharge home and family is NOT agreeable to placement other than [facility]. Pt's nephew informed CSW of his plans to speak with admissions liaison from [facility] on 01/05/2014 and with pt's insurance. CSW to continue to follow and assist with discharge planning needs.  Pati Gallo, [place] Social Worker [phone]

## 2014-01-05 NOTE — Clinical Social Work Note (Signed)
CSW notified by pt's nephew, Purvis Sheffield, of pt's acceptance to [facility] once medically stable for discharge. Per pt's nephew, a conference call was held between [place], [employer] and pt's nephew regarding SNF placement. Per pt's nephew, pt's insurance agreeable to 7 days of out-of-network SNF placement at [facility].  CSW has left message with admissions liaison with [facility] to confirm information above. CSW to continue to follow and assist with discharge planning as pt becomes medically stable.  Pati Gallo, [place] Social Worker [phone]

## 2014-01-05 NOTE — Progress Notes (Signed)
Stroke Team Progress Note  HISTORY Hailey Faulkner is an 80 y.o. female with a history of meningioma resection in 1990 subsequent seizure disorder controlled with Keppra 750 mg daily. The patient's family brought her to the emergency department on 01/01/2014 for evaluation of lethargy and slurred speech. She had recently missed some doses of Keppra and it was noted that she had a laceration of her tongue. There was concern that the patient experienced a seizure that was not witnessed. The patient lives alone and apparently had stopped taking one or more for blood pressure medications recently as well. An initial CT of the head showed no acute changes; however, an MRI today revealed an acute infarct of the left thalamus. Most of the history was obtained from the patient's nephew who was in the room as well as the chart. The patient has a history of right vocal cord paralysis and she is dysarthric, which is new. She had not been on antiplatelet therapy prior to admission. Will need to start aspirin.   Patient was not administerd TPA secondary to being outside the window. She was admitted to [facility] further evaluation and treatment.  SUBJECTIVE Patient up in chair at the bedside. Family report she was not taking her seizure medication as prescribed - missed a few doses last week per daughter, missed BP meds this week.  OBJECTIVE Most recent Vital Signs: Filed Vitals:   01/04/14 2115 01/05/14 0134 01/05/14 0505 01/05/14 0958  BP: 163/68 151/66 167/64 130/90  Pulse: 70 66 72 78  Temp: 98.5 F (36.9 C) 98.1 F (36.7 C) 97.4 F (36.3 C) 97.2 F (36.2 C)  TempSrc: Oral Oral Oral Oral  Resp: 18 18 18 18   Height:      Weight:      SpO2: 98% 97% 97% 86%   CBG (last 3)  No results found for this basename: GLUCAP,  in the last 72 hours  IV Fluid Intake:     MEDICATIONS  . atenolol  100 mg Oral Daily  . clopidogrel  75 mg Oral Q breakfast  . doxazosin  1 mg Oral QHS  . enoxaparin (LOVENOX)  injection  40 mg Subcutaneous Q24H  . levETIRAcetam  500 mg Oral BID  . levothyroxine  125 mcg Oral QAC breakfast  . losartan  100 mg Oral Daily  . pantoprazole  40 mg Oral QAC supper  . polyethylene glycol  17 g Oral Daily  . simvastatin  20 mg Oral q1800  . sodium chloride  3 mL Intravenous Q12H   PRN:  acetaminophen, acetaminophen, albuterol, food thickener, LORazepam, meclizine, ondansetron (ZOFRAN) IV, ondansetron, senna-docusate  Diet:  Dysphagia 1 pudding thick liquids Activity: Up with assistance DVT Prophylaxis:  lovenox  CLINICALLY SIGNIFICANT STUDIES Basic Metabolic Panel:   Recent Labs Lab 01/01/14 1548 01/02/14 0920  01/04/14 0526 01/05/14 0436  NA 139 140  < > 141 140  K 3.7 3.5*  < > 3.8 3.8  CL 101 103  < > 103 102  CO2 25 24  < > 23 22  GLUCOSE 119* 104*  < > 103* 108*  BUN 18 15  < > 19 19  CREATININE 0.83 0.79  < > 0.90 0.92  CALCIUM 8.8 8.6  < > 9.2 9.5  MG  --  2.0  --   --   --   < > = values in this interval not displayed. Liver Function Tests:   Recent Labs Lab 01/01/14 1548  AST 21  ALT 23  ALKPHOS 109  BILITOT 0.8  PROT 6.7  ALBUMIN 3.4*   CBC:   Recent Labs Lab 01/01/14 1548 01/02/14 0720 01/03/14 0345  WBC 13.2* 10.0 6.2  NEUTROABS 10.7* 7.3  --   HGB 11.4* 12.5 12.1  HCT 34.4* 36.4 36.9  MCV 91.0 91.7 91.3  PLT 171 157 166   Coagulation: No results found for this basename: LABPROT, INR,  in the last 168 hours Cardiac Enzymes: No results found for this basename: CKTOTAL, CKMB, CKMBINDEX, TROPONINI,  in the last 168 hours Urinalysis:   Recent Labs Lab 01/01/14 1604  COLORURINE YELLOW  LABSPEC 1.019  PHURINE 6.0  GLUCOSEU NEGATIVE  HGBUR NEGATIVE  BILIRUBINUR NEGATIVE  KETONESUR NEGATIVE  PROTEINUR NEGATIVE  UROBILINOGEN 0.2  NITRITE NEGATIVE  LEUKOCYTESUR NEGATIVE   Lipid Panel    Component Value Date/Time   CHOL 231* 01/03/2014 0345   TRIG 162* 01/03/2014 0345   HDL 66 01/03/2014 0345   CHOLHDL 3.5 01/03/2014  0345   VLDL 32 01/03/2014 0345   LDLCALC 133* 01/03/2014 0345   HgbA1C  Lab Results  Component Value Date   HGBA1C 6.5* 01/03/2014    Urine Drug Screen:   No results found for this basename: labopia,  cocainscrnur,  labbenz,  amphetmu,  thcu,  labbarb    Alcohol Level: No results found for this basename: ETH,  in the last 168 hours     CT of the brain  01/01/2014    No acute intracranial abnormality. Stable cerebral atrophy. Stable postsurgical changes and encephalomalacia in right frontal temporal lobe.     MRI of the brain  01/02/2014   Acute infarct left thalamus.  Stable postsurgical encephalomalacia right frontal lobe.  MRA of the brain  01/03/2014    Mild non-stenotic narrowing of the cavernous and supraclinoid ICA on the right, and mild non stenotic irregularity left P1 PCA. Moderately diseased A1 ACA on the right.  Otherwise, No flow-limiting intracranial stenosis or occlusion.   2D Echocardiogram  EF 55-60% with no source of embolus.   Carotid Doppler  No evidence of hemodynamically significant internal carotid artery stenosis. Vertebral artery flow is antegrade.   EEG This is an abnormal electroencephalogram due to a right breach rhythm with embedded sharp activity. This is consistent with the patient's history of a right craniotomy and seizure disorder. No electrographic seizures are noted.   CXR   01/02/2014   Stable bibasilar atelectasis  01/01/2014   Hypoventilation with mild bibasilar atelectasis     Therapy Recommendations HH PT, SNF per OT  Physical Exam  Mental Status:  Alert, oriented, thought content appropriate. Speech dysarthric but fluent. Able to follow 3 step commands without difficulty.  Cranial Nerves:  II:  Visual fields grossly normal, pupils equal, round, reactive to light III,IV, VI: Slight right ptosis present, extra-ocular motions intact bilaterally  V,VII: Very mild right lower facial weakness, facial light touch sensation normal bilaterally   Motor:  Right : Upper extremity 4/5 Left: Upper extremity 5/5  Lower extremity 5/5 Lower extremity 5/5  Tone and bulk:normal tone throughout; no atrophy noted  Sensory: light touch intact throughout, bilaterally  Cerebellar:  Difficulty with finger to nose on the right secondary to weakness  ASSESSMENT Hailey Faulkner is a 80 y.o. female presenting with lethargy and dysarthria, found to have an acute infarct in the left thalamus.  Infarct felt to be thrombotic secondary to small vessel disease.  On no anticoagulatuion prior to admission. Now on Plavix 75mg  daily for secondary  stroke prevention. Patient with resultant right sided weakness, dysphagia. Stroke work up completed.  hypertension   Hx seizures, no acute seizure per EEG  Hyperlipidemia, LDL 133, on no statin PTA, now on zocor 20 , goal LDL < 100   Hx Meningioma,  with L hemicraniectomy 25 years ago  Made DNR. Palliative care followup recommended at [facility] day # 4  TREATMENT/PLAN  Continue plavix for secondary stroke prevention  Continue current keppra dose. Will not consider increasing Keppra at this time. If she has break through seizures in the future, while taking the medication regularly, would increase keppra to 1000mg  daily.   Agree with plans for SNF at [facility] per SW note  No further stroke workup indicated.  Patient has a 10-15% risk of having another stroke over the next year, the highest risk is within 2 weeks of the most recent stroke/TIA (risk of having a stroke following a stroke or TIA is the same).  Ongoing risk factor control by Primary Care Physician  Stroke Service will sign off. Please call should any needs arise.  Follow up with Dr. Erlinda Hong, [facility], in 2 months.   Burnetta Sabin, MSN, RN, ANVP-BC, ANP-BC, [facility] Pager: [phone] 01/05/2014 11:16 AM  I have personally obtained a history, examined the patient, evaluated imaging results,  and formulated the assessment and plan of care. I agree with the above.  Antony Contras, MD   To contact Stroke Continuity provider, please refer to [email or web address] After hours, contact General Neurology

## 2014-01-05 NOTE — Progress Notes (Signed)
Full note to follow:  I have met with Hailey Faulkner and her nephew, Hailey Faulkner [facility]). She is very clear about her wishes and we completed MOST form: DNR, comfort measures (no return to the hospital), no antibiotics, no IV fluids and no feeding tube. She tells me she doesn't want anything to prolong her life and that she is tired and has been through a lot. She says that she wants to be comfortable and not to suffer. She plans on [employer] short term to see if she can regain some independence while her family sets up care for home - home is the ultimate goal. They wish for palliative to follow at [facility] and plan for home with hospice to help achieve her goals of comfort and to stay home.  Vinie Sill, NP Palliative Medicine Team Pager # [phone] (M-F 8a-5p) Team Phone # [phone] (Nights/Weekends)

## 2014-01-05 NOTE — Progress Notes (Signed)
[facility] PROGRESS NOTE  Hailey Faulkner [id number] DOB: 24-Dec-1929 DOA: 01/01/2014 PCP: Criselda Peaches, MD  Assessment/Plan: #1 acute encephalopathy Likely secondary to acute CVA +/- seizure disorder as patient was found to be lethargic, postictal, with tongue laceration and had missed some doses of the Keppra due to travel. Patient was placed on Keppra on admission. Patient currently alert oriented x3 and following commands. No seizures noted overnight per nursing. CT of the head is negative. MRI of the head is consistent with acute thalamic stroke. EEG with no electrographic seizures noted. Carotid dopplers with no significant ICA stenosis. 2 d echo with no source of emboli. Repeat chest x-ray negative for any acute infiltrate. Continue  Keppra for now. Patient has been changed to Plavix for secondary stroke prevention. Neurology following and appreciate input and recommendations.   #2 acute thalamic stroke Questionable etiology. Patient was not on aspirin prior to admission. 2-D echo with no source of emboli. Cariotid Dopplers with no significant ICA stenosis. Patient has been started on a statin. Aspirin has been changed to Plavix for secondary stroke prevention per neurology. Followup with neurology, Dr Erlinda Hong as outpatient in 2 months.   #2 history of seizure disorder See problem #1. EEG with no epileptiform disorder seen. Patient with no further episodes. Patient was noted to have missed doses of Keppra secondary to travel. Continue current dose of Keppra. Neurology following.   #3 hypertension BP improved. Will discontinue IV Lopressor and resume home regimen of atenolol, Cardura, Cozaar.   #4 Dysphagia Patient s/p MBS. Continue current diet.  #5 Prophylaxis Lovenox for DVT prophylaxis  Code Status: Full Family Communication: Updated patient and family at bedside. Disposition Plan: SNF when bed available hopefully tomorrow   Consultants:  Neurology: Dr. Aram Beecham  01/02/2014  Palliative care: Dr. Hilma Favors 01/04/2014  Procedures:  CT head 01/01/2014  Chest x-ray 01/01/2014, 01/02/2014  MRI head 01/02/2014  EEG 01/04/2014  2 d echo 01/03/14  Carotid dopplers 01/04/2014  Antibiotics:  None  HPI/Subjective: Patient with no complaints. No noted seizures overnight per nursing.  Objective: Filed Vitals:   01/05/14 1420  BP: 150/63  Pulse: 83  Temp: 97.9 F (36.6 C)  Resp: 18    Intake/Output Summary (Last 24 hours) at 01/05/14 1521 Last data filed at 01/05/14 1333  Gross per 24 hour  Intake    243 ml  Output      0 ml  Net    243 ml   Filed Weights   01/01/14 2141 01/02/14 2007  Weight: 82.056 kg (180 lb 14.4 oz) 77.565 kg (171 lb)    Exam:   General:  NAD. Patient with dysarthric speech and a soft voice.  Cardiovascular: RRR  Respiratory: CTAB  Abdomen: Soft/NT/ND/+BS  Musculoskeletal: No c/c/e  Data Reviewed: Basic Metabolic Panel:  Recent Labs Lab 01/01/14 1548 01/02/14 0920 01/03/14 0345 01/04/14 0526 01/05/14 0436  NA 139 140 138 141 140  K 3.7 3.5* 3.8 3.8 3.8  CL 101 103 102 103 102  CO2 25 24 22 23 22   GLUCOSE 119* 104* 107* 103* 108*  BUN 18 15 17 19 19   CREATININE 0.83 0.79 0.86 0.90 0.92  CALCIUM 8.8 8.6 9.1 9.2 9.5  MG  --  2.0  --   --   --    Liver Function Tests:  Recent Labs Lab 01/01/14 1548  AST 21  ALT 23  ALKPHOS 109  BILITOT 0.8  PROT 6.7  ALBUMIN 3.4*   No results found for this  basename: LIPASE, AMYLASE,  in the last 168 hours No results found for this basename: AMMONIA,  in the last 168 hours CBC:  Recent Labs Lab 01/01/14 1548 01/02/14 0720 01/03/14 0345  WBC 13.2* 10.0 6.2  NEUTROABS 10.7* 7.3  --   HGB 11.4* 12.5 12.1  HCT 34.4* 36.4 36.9  MCV 91.0 91.7 91.3  PLT 171 157 166   Cardiac Enzymes: No results found for this basename: CKTOTAL, CKMB, CKMBINDEX, TROPONINI,  in the last 168 hours BNP (last 3 results) No results found for this basename:  PROBNP,  in the last 8760 hours CBG:  Recent Labs Lab 01/01/14 2016  GLUCAP 107*    Recent Results (from the past 240 hour(s))  URINE CULTURE     Status: None   Collection Time    01/01/14  4:04 PM      Result Value Ref Range Status   Specimen Description URINE, CATHETERIZED   Final   Special Requests NONE   Final   Culture  Setup Time     Final   Value: 01/01/2014 21:20     Performed at [facility]     Final   Value: NO GROWTH     Performed at [facility]   Culture     Final   Value: NO GROWTH     Performed at [facility]   Report Status 01/02/2014 FINAL   Final     Studies: Dg Swallowing Func-speech Pathology  01/04/2014   [place], CCC-SLP     01/04/2014 12:29 PM Objective Swallowing Evaluation: Modified Barium Swallowing Study   Patient Details  Name: Hailey Faulkner MRN: [id number] Date of Birth: 1930/06/21  Today's Date: 01/04/2014 Time: 1030-1050 SLP Time Calculation (min): 20 min  Past Medical History:  Past Medical History  Diagnosis Date  . History of UTI   . Pancreatitis   . HBP (high blood pressure)   . OP (osteoporosis)   . Vocal cord paralysis     RIGHT VOCAL CORD  . History of malignant gastrointestinal stromal tumor (GIST)   . Hypothyroidism   . Seizure disorder   . SIADH (syndrome of inappropriate ADH production)   . Melanoma     vulvar  . Meningioma   . Gait instability   . CVA (cerebral infarction)    Past Surgical History:  Past Surgical History  Procedure Laterality Date  . Thyroidectomy, partial  1970  . Cholecystectomy  10/2007  . Gastrectomy  2007  . Laryngoplasty     HPI:  80 yo female with mental status change, CVA versus seizure  disorder as patient was found to be lethargic, postictal, with  tongue laceration and had missed some doses of the Keppra due to  travel.  Patient was placed on IV Keppra on admission. Pt found  to have left thalamic cva per imaging study 5/24, CXR 5/24  negative - stable ATX. PMH +  for meningioma s/p resection, vocal  fold paralysis s/p laryngoplasty, seizure.  OT reports pt  coughing with water today and RN confirms.  Pt resides alone and  nephew checks on her on occasion.  BSE recommended MBS.       Assessment / Plan / Recommendation Clinical Impression  Dysphagia Diagnosis: Mild oral phase dysphagia;Moderate oral  phase dysphagia;Moderate pharyngeal phase dysphagia;Severe  pharyngeal phase dysphagia Clinical impression: Pt. exhibited mild-moderate oral dysphagia  marked by consistent lingual residue with intermittent awareness.   Moderate-severe sensorimotor based pharyngeal  dysphagia  indicated by decreased sensation, reduced tongue base retraction  and decreased laryngeal elevation leading to primarily silent  laryngeal aspiration/penetration during initial swallow as well  as from pharyngeal residue.  A right head turn (toward paralyzed  vocal cord) and chin tuck (during separate trial) were both  ineffective.  Pt. unable to effectively clear/reduce penetrates  with ineffective cough/throat clear.  Unfortunately she is at  high aspiration risk with any consistency thinner than puree at  present.  SLP recommends Dys 1 only, pudding thick liquids, crush  pills, swallow 2 times and full assist.  ST will continue to  follow.     Treatment Recommendation  Therapy as outlined in treatment plan below    Diet Recommendation Dysphagia 1 (Puree);Pudding-thick liquid   Liquid Administration via: Spoon Medication Administration: Crushed with puree Supervision: Patient able to self feed;Full supervision/cueing  for compensatory strategies Compensations: Slow rate;Small sips/bites;Multiple dry swallows  after each bite/sip;Clear throat intermittently Postural Changes and/or Swallow Maneuvers: Seated upright 90  degrees;Upright 30-60 min after meal    Other  Recommendations Oral Care Recommendations: Oral care BID Other Recommendations: Order thickener from pharmacy   Follow Up Recommendations  Skilled  Nursing facility    Frequency and Duration min 2x/week  2 weeks   Pertinent Vitals/Pain WDL          Reason for Referral Objectively evaluate swallowing function   Oral Phase Oral Preparation/Oral Phase Oral Phase: Impaired Oral - Honey Oral - Honey Teaspoon: Lingual/palatal residue;Weak lingual  manipulation Oral - Honey Cup: Lingual/palatal residue;Weak lingual  manipulation Oral - Nectar Oral - Nectar Teaspoon: Lingual/palatal residue;Weak lingual  manipulation Oral - Nectar Cup: Lingual/palatal residue;Weak lingual  manipulation Oral - Solids Oral - Puree: Lingual/palatal residue;Weak lingual manipulation   Pharyngeal Phase Pharyngeal Phase Pharyngeal Phase: Impaired Pharyngeal - Honey Pharyngeal - Honey Teaspoon: Penetration/Aspiration during  swallow;Pharyngeal residue - valleculae;Pharyngeal residue -  pyriform sinuses;Reduced laryngeal elevation;Reduced tongue base  retraction;Delayed swallow initiation;Premature spillage to  pyriform sinuses Penetration/Aspiration details (honey teaspoon): Material enters  airway, passes BELOW cords without attempt by patient to eject  out (silent aspiration) Pharyngeal - Honey Cup: Penetration/Aspiration during  swallow;Pharyngeal residue - valleculae;Pharyngeal residue -  pyriform sinuses;Reduced tongue base retraction;Reduced laryngeal  elevation;Delayed swallow initiation;Premature spillage to  pyriform sinuses Penetration/Aspiration details (honey cup): Material enters  airway, passes BELOW cords without attempt by patient to eject  out (silent aspiration);Material enters airway, passes BELOW  cords and not ejected out despite cough attempt by patient Pharyngeal - Nectar Pharyngeal - Nectar Teaspoon: Penetration/Aspiration during  swallow;Pharyngeal residue - valleculae;Pharyngeal residue -  pyriform sinuses;Reduced laryngeal elevation;Reduced tongue base  retraction;Reduced airway/laryngeal closure (attempted right head  turn (ineffective)) Penetration/Aspiration  details (nectar teaspoon): Material enters  airway, passes BELOW cords without attempt by patient to eject  out (silent aspiration) Pharyngeal - Nectar Cup: Not tested Pharyngeal - Solids Pharyngeal - Puree: Delayed swallow initiation;Premature spillage  to valleculae;Pharyngeal residue - valleculae;Reduced tongue base  retraction  Cervical Esophageal Phase    GO    Cervical Esophageal Phase Cervical Esophageal Phase: Darryll Capers         Cranford Mon.Ed CCC-SLP Pager [phone]  01/04/2014    Scheduled Meds: . atenolol  100 mg Oral Daily  . clopidogrel  75 mg Oral Q breakfast  . doxazosin  1 mg Oral QHS  . enoxaparin (LOVENOX) injection  40 mg Subcutaneous Q24H  . levETIRAcetam  250 mg Oral Once  . [START ON 01/06/2014] levETIRAcetam  250 mg Oral  Daily  . levothyroxine  125 mcg Oral QAC breakfast  . losartan  100 mg Oral Daily  . pantoprazole  40 mg Oral QAC supper  . polyethylene glycol  17 g Oral Daily  . simvastatin  20 mg Oral q1800  . sodium chloride  3 mL Intravenous Q12H   Continuous Infusions:   Principal Problem:   Acute encephalopathy Active Problems:   Seizures   CVA (cerebral infarction)   NEOPLASM, BENIGN, STOMACH   Vocal cord paralysis   history of Malignant gastrointestinal stromal tumor (GIST) of stomach   Essential hypertension, benign   Meningioma, resected   Speech abnormality    Time spent: [facility] Pager [phone]. If 7PM-7AM, please contact night-coverage at [email or web address], password [facility] 01/05/2014, 3:21 PM  LOS: 4 days

## 2014-01-05 NOTE — Progress Notes (Signed)
Speech Language Pathology Treatment: Cognitive-Linquistic;Dysphagia  Patient Details Name: Hailey Faulkner MRN: [id number] DOB: Apr 07, 1930 Today's Date: 01/05/2014 Time: 4270-6237 SLP Time Calculation (min): 14 min  Assessment / Plan / Recommendation Clinical Impression  Pt was seen for f/u treatment with sister present. SLP reviewed results/recommendations from [facility] yesterday, with both pt and her sister engaged and asking questions. SLP stressed the importance of following recommendations due to known high aspiration risk. Pt consumed bites of applesauce with coughing and subjective c/o residue, requiring Max cues for second swallow and a more effortful cough. SLP had pt bear down during cough and phonation, although with no increase in subglottal pressure appreciated. Pt required Max cues in conversational speech to utilize speech intelligibility strategies, often running out of breath after 1-2 words and not pausing to take in more air.  SLP introduced two pharyngeal strengthening exercises (effortful swallow, masako), providing Mod-Max cues for completion. Pt did demonstrate anticipatory awareness of decreased ability to recall of new information, asking SLP to write down exercises in order for her to try them later. Continue plan of care.   HPI HPI: 80 yo female with mental status change, CVA versus seizure disorder as patient was found to be lethargic, postictal, with tongue laceration and had missed some doses of the Keppra due to travel.  Patient was placed on IV Keppra on admission. Pt found to have left thalamic cva per imaging study 5/24, CXR 5/24 negative - stable ATX. PMH + for meningioma s/p resection, vocal fold paralysis s/p laryngoplasty, seizure.  OT reports pt coughing with water today and RN confirms.  Pt resides alone and nephew checks on her on occasion.  BSE recommended MBS.     Pertinent Vitals N/A  SLP Plan  Continue with current plan of care    Recommendations      Oral Care Recommendations: Oral care BID Follow up Recommendations: Skilled Nursing facility Plan: Continue with current plan of care    GO      Germain Osgood, M.A. CCC-SLP [phone]  Germain Osgood 01/05/2014, 4:07 PM

## 2014-01-06 MED ORDER — SIMVASTATIN 20 MG PO TABS: 20.0000 mg | ORAL_TABLET | Freq: Every day | ORAL | Status: DC

## 2014-01-06 MED ORDER — CLOPIDOGREL BISULFATE 75 MG PO TABS: 75.0000 mg | ORAL_TABLET | Freq: Every day | ORAL | Status: DC

## 2014-01-06 MED ORDER — LEVETIRACETAM 500 MG PO TABS
500.0000 mg | ORAL_TABLET | Freq: Once | ORAL | Status: AC
Start: 2014-01-06 — End: 2014-01-06
  Administered 2014-01-06: 500 mg via ORAL
  Filled 2014-01-06: qty 1

## 2014-01-06 MED ORDER — SENNOSIDES-DOCUSATE SODIUM 8.6-50 MG PO TABS: 1.0000 | ORAL_TABLET | Freq: Every evening | ORAL | Status: DC | PRN

## 2014-01-06 NOTE — Progress Notes (Signed)
Patient discharged from room 4N23 at this time. Report given to nurse Wilfred Curtis at [place]. Alert and in stable condition. Transported by [facility] with belongings at side. Patient understood where she was being transferred to and why.

## 2014-01-06 NOTE — Discharge Summary (Signed)
Physician Discharge Summary  Hailey Faulkner TIR:[id number] DOB: 02-Feb-1930 DOA: 01/01/2014  PCP: Criselda Peaches, MD  Admit date: 01/01/2014 Discharge date: 01/06/2014  Time spent: >61minutes  Recommendations for Outpatient Follow-up:  Follow-up Information   Follow up with Xu,Jindong, MD. Schedule an appointment as soon as possible for a visit in 2 months. (Stroke Clinic)    Specialty:  Neurology   Contact information:   [address] [place] [address] [phone]       Please follow up. (Palliative care to follow patient at [place])       Please follow up. (SNF MD in 1-2days)        Discharge Diagnoses:  Principal Problem:   Acute encephalopathy Active Problems:   NEOPLASM, BENIGN, STOMACH   Vocal cord paralysis   history of Malignant gastrointestinal stromal tumor (GIST) of stomach   Seizures   Essential hypertension, benign   Meningioma, resected   Speech abnormality   CVA (cerebral infarction)   Discharge Condition: Stable  Diet recommendation: Dysphagia 1, with pudding thick liquids, all medications to be crushed  [facility]   01/01/14 2141 01/02/14 2007  Weight: 82.056 kg (180 lb 14.4 oz) 77.565 kg (171 lb)    History of present illness:  Hailey Faulkner is a 80 y.o. female  With h/o resected meningioma, seizures, vocal cord paralysis brought to ED by family who noted she was difficult to rouse today and had difficult to understand speech. Pt reports feeling sick after eating [employer] last night, but was otherwise in her usual state of health. Family noted she was sleeping later than usual. They checked on her at around 2 pm. She was lethargic and difficult to understand. They thought also her tongue seemed swollen. They noted she had vomited in bed. No cough, fever, chills. CT brain showed nothing acute. CXR showed no definite infiltrate. WBC 13000. UA negative. Patient was out of town this week and missed  several doses of keppra. She was admitted for further evaluation and management.   Hospital Course:  #1 acute encephalopathy  As discussed above, the impression was that  It was Likely secondary to acute CVA +/- seizure disorder as patient was found to be lethargic, postictal, with tongue laceration and had missed some doses of the Keppra due to travel, and her MRI also revealed an acute thalamic stroke. On admission she was placed on Keppra and neurology was consulted. Her mentation improved, she is currently alert oriented x3 and following commands. She has not had further seizures. CT of the head is negative. EEG with no electrographic seizures noted. Carotid dopplers with no significant ICA stenosis. 2 d echo with no source of emboli. Repeat chest x-ray negative for any acute infiltrate. Neuro followed and final recommendations were to continue her preadmission dose of Keppra since she missed doses prior to this presentation, and if in the future she has break through seizures , while taking the medication regularly, they stated there would recommend to increase keppra to $RemoveB'1000mg'fSjQtisn$  daily then.  . Patient has been changed to Plavix for secondary stroke prevention. Neurology recommends for patient to followup with Dr. Erlinda Hong in 2 months #2 acute thalamic stroke  As discussed above, MRI was done which revealed a thalamic stroke .neuro was consulted and saw patient. Patient was initially continued on aspirin on admission. 2-D echo with no source of emboli. Cariotid Dopplers with no significant ICA stenosis. She was started on a statin as well.  Aspirin as been changed to Plavix for secondary stroke prevention per neurology. Followup with neurology, Dr Erlinda Hong as outpatient in 2 months.  -Palliative care was consulted and they met with patient's family on 5/27 her wishes are to be a DO NOT RESUSCITATE, and she wants to be kept comfortable and not to suffer>> the palliative care team recommends for her to be followed up  by palliative care at the [facility] short-term while her family sets up care for her at home and her ultimate goal is to go home. #2 history of seizure disorder  See problem #1. EEG with no epileptiform disorder seen. Patient with no further episodes. Patient was noted to have missed doses of Keppra secondary to travel. Continue current dose of Keppra.  #3 hypertension  BP improved, she was managed with IV antihypertensives when she was n.p.o.,  And home regimen of atenolol, Cardura, Cozaar was resumed once she was able to take by mouth.  #4 Dysphagia  Patient s/p MBS. Continue current diet.    Procedures:  CT head 01/01/2014  Chest x-ray 01/01/2014, 01/02/2014  MRI head 01/02/2014  EEG 01/04/2014  2 d echo 01/03/14  Carotid dopplers 01/04/2014      Discharge Exam: Filed Vitals:   01/06/14 1000  BP: 133/65  Pulse: 73  Temp: 98 F (36.7 C)  Resp: 18   Exam:  General: NAD. Patient with dysarthric speech and a soft voice.  Cardiovascular: RRR  Respiratory: CTAB  Abdomen: Soft/NT/ND/+BS  Musculoskeletal: No c/c/e    Discharge Instructions You were cared for by a hospitalist during your hospital stay. If you have any questions about your discharge medications or the care you received while you were in the hospital after you are discharged, you can call the unit and asked to speak with the hospitalist on call if the hospitalist that took care of you is not available. Once you are discharged, your primary care physician will handle any further medical issues. Please note that NO REFILLS for any discharge medications will be authorized once you are discharged, as it is imperative that you return to your primary care physician (or establish a relationship with a primary care physician if you do not have one) for your aftercare needs so that they can reassess your need for medications and monitor your lab values.  Discharge Instructions   Increase activity slowly    Complete  by:  As directed             Medication List         acetaminophen 325 MG tablet  Commonly known as:  TYLENOL  Take 650 mg by mouth every 6 (six) hours as needed.     atenolol 100 MG tablet  Commonly known as:  TENORMIN  Take 100 mg by mouth daily.     CALCIUM + D PO  Take by mouth 2 (two) times daily.     clopidogrel 75 MG tablet  Commonly known as:  PLAVIX  Take 1 tablet (75 mg total) by mouth daily with breakfast.     doxazosin 1 MG tablet  Commonly known as:  CARDURA  Take 1 mg by mouth at bedtime.     esomeprazole 40 MG capsule  Commonly known as:  NEXIUM  Take 40 mg by mouth daily before breakfast.     Levetiracetam 750 MG Tb24  Take 1 tablet (750 mg total) by mouth daily.     levothyroxine 125 MCG tablet  Commonly known as:  SYNTHROID,  LEVOTHROID  Take 125 mcg by mouth daily.     losartan 100 MG tablet  Commonly known as:  COZAAR  Take 100 mg by mouth daily.     meclizine 12.5 MG tablet  Commonly known as:  ANTIVERT  Take 12.5 mg by mouth 3 (three) times daily as needed.     polyethylene glycol packet  Commonly known as:  MIRALAX / GLYCOLAX  Take 17 g by mouth daily.     senna-docusate 8.6-50 MG per tablet  Commonly known as:  Senokot-S  Take 1 tablet by mouth at bedtime as needed for mild constipation.     simvastatin 20 MG tablet  Commonly known as:  ZOCOR  Take 1 tablet (20 mg total) by mouth daily at 6 PM.     VITAMIN B-12 IJ  Inject as directed every 30 (thirty) days.       Allergies  Allergen Reactions  . Hctz [Hydrochlorothiazide]   . Hydrocodone        Follow-up Information   Follow up with Xu,Jindong, MD. Schedule an appointment as soon as possible for a visit in 2 months. (Stroke Clinic)    Specialty:  Neurology   Contact information:   [address] [place] [address] [phone]        The results of significant diagnostics from this hospitalization (including imaging, microbiology,  ancillary and laboratory) are listed below for reference.    Significant Diagnostic Studies: Ct Head Wo Contrast  01/01/2014   CLINICAL DATA:  Altered mental status  EXAM: CT HEAD WITHOUT CONTRAST  TECHNIQUE: Contiguous axial images were obtained from the base of the skull through the vertex without intravenous contrast.  COMPARISON:  11/01/2010  FINDINGS: Again noted status post right temporofrontal craniotomy. Underlying and to follow malacia in the right frontal and temporal lobes again noted. Ex vacuo dilatation of the right lateral ventricle again noted. No intracranial hemorrhage, mass effect or midline shift. Ventricular size is stable from prior exam. No acute cortical infarction. No mass lesion is noted on this unenhanced scan. The visualized paranasal sinuses and mastoid air cells are unremarkable. Mild mucosal thickening noted posterior aspect of the right sphenoid sinus. Stable cerebral atrophy.  IMPRESSION: No acute intracranial abnormality. Stable cerebral atrophy. Stable postsurgical changes and encephalomalacia in right frontal temporal lobe.   Electronically Signed   By: Lahoma Crocker M.D.   On: 01/01/2014 14:58   Mr Brain Wo Contrast  01/02/2014   CLINICAL DATA:  Dysarthria.  History of meningioma resection.  EXAM: MRI HEAD WITHOUT CONTRAST  TECHNIQUE: Multiplanar, multiecho pulse sequences of the brain and surrounding structures were obtained without intravenous contrast.  COMPARISON:  CT head 01/01/2014  FINDINGS: Acute infarct in the left lateral thalamus measuring approximately 10 x 12 mm. No other acute infarct identified.  Right frontal craniectomy for tumor resection. Large area of encephalomalacia in the right frontal lobe is stable. Right frontal horn is enlarged. Ventricles are diffusely dilated but stable from prior studies.  Image quality degraded by motion.  Negative for hemorrhage or mass.  IMPRESSION: Acute infarct left thalamus.  Stable postsurgical encephalomalacia right  frontal lobe.   Electronically Signed   By: Franchot Gallo M.D.   On: 01/02/2014 12:52   Dg Chest Port 1 View  01/02/2014   CLINICAL DATA:  Shortness of breath, possible aspiration  EXAM: PORTABLE CHEST - 1 VIEW  COMPARISON:  DG CHEST 1V PORT dated 01/01/2014  FINDINGS: Stable cardiac and mediastinal contours.  Unchanged bilateral lower lung heterogeneous opacities. No pleural effusion or pneumothorax. Regional skeleton unremarkable.  IMPRESSION: Stable bibasilar atelectasis   Electronically Signed   By: Lovey Newcomer M.D.   On: 01/02/2014 11:20   Dg Chest Portable 1 View  01/01/2014   CLINICAL DATA:  Altered mental status.  Fever  EXAM: PORTABLE CHEST - 1 VIEW  COMPARISON:  08/11/2012  FINDINGS: Decreased lung volume compared with the prior study. Mild bibasilar atelectasis. Negative for heart failure or effusion.  IMPRESSION: Hypoventilation with mild bibasilar atelectasis   Electronically Signed   By: Franchot Gallo M.D.   On: 01/01/2014 15:53   Dg Swallowing Func-speech Pathology  01/04/2014   [place], CCC-SLP     01/04/2014 12:29 PM Objective Swallowing Evaluation: Modified Barium Swallowing Study   Patient Details  Name: Helem Reesor MRN: [id number] Date of Birth: 11-Apr-1930  Today's Date: 01/04/2014 Time: 1030-1050 SLP Time Calculation (min): 20 min  Past Medical History:  Past Medical History  Diagnosis Date  . History of UTI   . Pancreatitis   . HBP (high blood pressure)   . OP (osteoporosis)   . Vocal cord paralysis     RIGHT VOCAL CORD  . History of malignant gastrointestinal stromal tumor (GIST)   . Hypothyroidism   . Seizure disorder   . SIADH (syndrome of inappropriate ADH production)   . Melanoma     vulvar  . Meningioma   . Gait instability   . CVA (cerebral infarction)    Past Surgical History:  Past Surgical History  Procedure Laterality Date  . Thyroidectomy, partial  1970  . Cholecystectomy  10/2007  . Gastrectomy  2007  . Laryngoplasty     HPI:  80 yo female with mental status  change, CVA versus seizure  disorder as patient was found to be lethargic, postictal, with  tongue laceration and had missed some doses of the Keppra due to  travel.  Patient was placed on IV Keppra on admission. Pt found  to have left thalamic cva per imaging study 5/24, CXR 5/24  negative - stable ATX. PMH + for meningioma s/p resection, vocal  fold paralysis s/p laryngoplasty, seizure.  OT reports pt  coughing with water today and RN confirms.  Pt resides alone and  nephew checks on her on occasion.  BSE recommended MBS.       Assessment / Plan / Recommendation Clinical Impression  Dysphagia Diagnosis: Mild oral phase dysphagia;Moderate oral  phase dysphagia;Moderate pharyngeal phase dysphagia;Severe  pharyngeal phase dysphagia Clinical impression: Pt. exhibited mild-moderate oral dysphagia  marked by consistent lingual residue with intermittent awareness.   Moderate-severe sensorimotor based pharyngeal dysphagia  indicated by decreased sensation, reduced tongue base retraction  and decreased laryngeal elevation leading to primarily silent  laryngeal aspiration/penetration during initial swallow as well  as from pharyngeal residue.  A right head turn (toward paralyzed  vocal cord) and chin tuck (during separate trial) were both  ineffective.  Pt. unable to effectively clear/reduce penetrates  with ineffective cough/throat clear.  Unfortunately she is at  high aspiration risk with any consistency thinner than puree at  present.  SLP recommends Dys 1 only, pudding thick liquids, crush  pills, swallow 2 times and full assist.  ST will continue to  follow.     Treatment Recommendation  Therapy as outlined in treatment plan below    Diet Recommendation Dysphagia 1 (Puree);Pudding-thick liquid   Liquid Administration via: Spoon Medication Administration: Crushed with puree Supervision: Patient able to  self feed;Full supervision/cueing  for compensatory strategies Compensations: Slow rate;Small sips/bites;Multiple dry  swallows  after each bite/sip;Clear throat intermittently Postural Changes and/or Swallow Maneuvers: Seated upright 90  degrees;Upright 30-60 min after meal    Other  Recommendations Oral Care Recommendations: Oral care BID Other Recommendations: Order thickener from pharmacy   Follow Up Recommendations  Skilled Nursing facility    Frequency and Duration min 2x/week  2 weeks   Pertinent Vitals/Pain WDL          Reason for Referral Objectively evaluate swallowing function   Oral Phase Oral Preparation/Oral Phase Oral Phase: Impaired Oral - Honey Oral - Honey Teaspoon: Lingual/palatal residue;Weak lingual  manipulation Oral - Honey Cup: Lingual/palatal residue;Weak lingual  manipulation Oral - Nectar Oral - Nectar Teaspoon: Lingual/palatal residue;Weak lingual  manipulation Oral - Nectar Cup: Lingual/palatal residue;Weak lingual  manipulation Oral - Solids Oral - Puree: Lingual/palatal residue;Weak lingual manipulation   Pharyngeal Phase Pharyngeal Phase Pharyngeal Phase: Impaired Pharyngeal - Honey Pharyngeal - Honey Teaspoon: Penetration/Aspiration during  swallow;Pharyngeal residue - valleculae;Pharyngeal residue -  pyriform sinuses;Reduced laryngeal elevation;Reduced tongue base  retraction;Delayed swallow initiation;Premature spillage to  pyriform sinuses Penetration/Aspiration details (honey teaspoon): Material enters  airway, passes BELOW cords without attempt by patient to eject  out (silent aspiration) Pharyngeal - Honey Cup: Penetration/Aspiration during  swallow;Pharyngeal residue - valleculae;Pharyngeal residue -  pyriform sinuses;Reduced tongue base retraction;Reduced laryngeal  elevation;Delayed swallow initiation;Premature spillage to  pyriform sinuses Penetration/Aspiration details (honey cup): Material enters  airway, passes BELOW cords without attempt by patient to eject  out (silent aspiration);Material enters airway, passes BELOW  cords and not ejected out despite cough attempt by patient  Pharyngeal - Nectar Pharyngeal - Nectar Teaspoon: Penetration/Aspiration during  swallow;Pharyngeal residue - valleculae;Pharyngeal residue -  pyriform sinuses;Reduced laryngeal elevation;Reduced tongue base  retraction;Reduced airway/laryngeal closure (attempted right head  turn (ineffective)) Penetration/Aspiration details (nectar teaspoon): Material enters  airway, passes BELOW cords without attempt by patient to eject  out (silent aspiration) Pharyngeal - Nectar Cup: Not tested Pharyngeal - Solids Pharyngeal - Puree: Delayed swallow initiation;Premature spillage  to valleculae;Pharyngeal residue - valleculae;Reduced tongue base  retraction  Cervical Esophageal Phase    GO    Cervical Esophageal Phase Cervical Esophageal Phase: Darryll Capers         Cranford Mon.Ed CCC-SLP Pager [phone]  01/04/2014   Mr Jodene Nam Head/brain Wo Cm  01/03/2014   CLINICAL DATA:  Followup cerebral vascular accident.  EXAM: MRA HEAD WITHOUT CONTRAST  TECHNIQUE: Angiographic images of the Circle of Willis were obtained using MRA technique without intravenous contrast.  COMPARISON:  MRI brain 01/02/2014.  FINDINGS: The left internal carotid artery is widely patent. There is mild non stenotic narrowing at the cavernous and supraclinoid ICA on the right. No skull base ICA lesions.  Basilar artery widely patent with vertebrals codominant.  Moderately diseased A1 ACA on the right. Both anterior cerebral is distally appear well supplied from the left ACA. No MCA stenosis or occlusion. No MCA branch occlusion. The mild non stenotic irregularity P1 PCA on the left. Mild irregularity both distal PCAs. No cerebellar branch occlusion. No intracranial aneurysm.  IMPRESSION: Mild non-stenotic narrowing of the cavernous and supraclinoid ICA on the right, and mild non stenotic irregularity left P1 PCA. Moderately diseased A1 ACA on the right.  Otherwise, No flow-limiting intracranial stenosis or occlusion.   Electronically Signed   By: Rolla Flatten M.D.    On: 01/03/2014 16:02    Microbiology: Recent Results (from the past 240 hour(s))  URINE CULTURE     Status: None   Collection Time    01/01/14  4:04 PM      Result Value Ref Range Status   Specimen Description URINE, CATHETERIZED   Final   Special Requests NONE   Final   Culture  Setup Time     Final   Value: 01/01/2014 21:20     Performed at [facility]     Final   Value: NO GROWTH     Performed at [facility]   Culture     Final   Value: NO GROWTH     Performed at [facility]   Report Status 01/02/2014 FINAL   Final     Labs: Basic Metabolic Panel:  Recent Labs Lab 01/01/14 1548 01/02/14 0920 01/03/14 0345 01/04/14 0526 01/05/14 0436  NA 139 140 138 141 140  K 3.7 3.5* 3.8 3.8 3.8  CL 101 103 102 103 102  CO2 $Re'25 24 22 23 22  'kiQ$ GLUCOSE 119* 104* 107* 103* 108*  BUN $Re'18 15 17 19 19  'WIl$ CREATININE 0.83 0.79 0.86 0.90 0.92  CALCIUM 8.8 8.6 9.1 9.2 9.5  MG  --  2.0  --   --   --    Liver Function Tests:  Recent Labs Lab 01/01/14 1548  AST 21  ALT 23  ALKPHOS 109  BILITOT 0.8  PROT 6.7  ALBUMIN 3.4*   No results found for this basename: LIPASE, AMYLASE,  in the last 168 hours No results found for this basename: AMMONIA,  in the last 168 hours CBC:  Recent Labs Lab 01/01/14 1548 01/02/14 0720 01/03/14 0345  WBC 13.2* 10.0 6.2  NEUTROABS 10.7* 7.3  --   HGB 11.4* 12.5 12.1  HCT 34.4* 36.4 36.9  MCV 91.0 91.7 91.3  PLT 171 157 166   Cardiac Enzymes: No results found for this basename: CKTOTAL, CKMB, CKMBINDEX, TROPONINI,  in the last 168 hours BNP: BNP (last 3 results) No results found for this basename: PROBNP,  in the last 8760 hours CBG:  Recent Labs Lab 01/01/14 2016  GLUCAP 107*       Signed:  Breuna Loveall C Alisyn Lequire  [facility] 01/06/2014, 1:17 PM

## 2014-01-06 NOTE — Progress Notes (Signed)
Physical Therapy Treatment Patient Details Name: Hailey Faulkner MRN: [id number] DOB: July 06, 1930 Today's Date: 01/06/2014    History of Present Illness Patient is an 80 yo female admitted 01/01/14 due to lethargy and slurred speech.  Patient with h/o seizures, resected meningioma, vocal coard paralysis, HTN, GI tumor, melanoma, CVA, gait instability. MRI revealed acute infarct in left thalamus.    PT Comments    Progressing steadily with transfers and tolerating gait activity.   Follow Up Recommendations  SNF     Equipment Recommendations  None recommended by PT    Recommendations for Other Services       Precautions / Restrictions Precautions Precautions: Fall Restrictions Weight Bearing Restrictions: No    Mobility  Bed Mobility                  Transfers Overall transfer level: Needs assistance Equipment used: Rolling walker (2 wheeled) Transfers: Sit to/from Stand Sit to Stand: Min guard         General transfer comment: cues for hand placement, no assistance needed.  Ambulation/Gait Ambulation/Gait assistance: Min guard Ambulation Distance (Feet): 65 Feet (then additional  160 feet) Assistive device: Rolling walker (2 wheeled) Gait Pattern/deviations: Step-through pattern Gait velocity: Decreased   General Gait Details: generally steady, but slow with little ability to increase speed safely   Stairs            Wheelchair Mobility    Modified Rankin (Stroke Patients Only)       Balance Overall balance assessment: Needs assistance Sitting-balance support: Feet supported;No upper extremity supported Sitting balance-Leahy Scale: Fair       Standing balance-Leahy Scale: Fair                      Cognition Arousal/Alertness: Awake/alert Behavior During Therapy: WFL for tasks assessed/performed Overall Cognitive Status: Within Functional Limits for tasks assessed                      Exercises      General  Comments        Pertinent Vitals/Pain     Home Living                      Prior Function            PT Goals (current goals can now be found in the care plan section) Acute Rehab PT Goals Patient Stated Goal: not stated PT Goal Formulation: With patient Time For Goal Achievement: 01/09/14 Potential to Achieve Goals: Good Progress towards PT goals: Progressing toward goals    Frequency  Min 3X/week    PT Plan Current plan remains appropriate    Co-evaluation             End of Session   Activity Tolerance: Patient tolerated treatment well Patient left: in chair;with call bell/phone within reach     Time: 1521-1545 PT Time Calculation (min): 24 min  Charges:  $Gait Training: 8-22 mins $Therapeutic Activity: 8-22 mins                    G Codes:      [employer] V 01/06/2014, 3:54 PM 01/06/2014  Donnella Sham, PT [phone]  (pager)

## 2014-01-06 NOTE — Clinical Social Work Note (Addendum)
CSW confirmed bed availability at [facility] with admissions liaison once pt is medically stable for discharge. CSW continuing to follow and assist with discharge planning.  Discharge summary faxed to [facility]. Discharge packet complete and placed on pt's shadow chart. CSW has left message with pt's nephew, Purvis Sheffield, regarding pt's discharge to [facility]. Pt to be transported via EMS ([facility]).  RN to please call report to [facility] at [place], [place] Worker [phone]

## 2014-01-06 NOTE — Consult Note (Signed)
I have reviewed this case with our NP and agree with the Assessment and Plan as stated.  Aleathea Pugmire L. Evia Goldsmith, MD MBA The Palliative Medicine Team at [place] Team Phone: [phone] Pager: [phone]   

## 2014-03-10 ENCOUNTER — Encounter: Payer: Self-pay | Admitting: Gastroenterology

## 2014-03-14 ENCOUNTER — Encounter: Payer: Self-pay | Admitting: Neurology

## 2014-03-25 ENCOUNTER — Ambulatory Visit: Payer: Medicare Other | Admitting: Neurology

## 2014-03-25 ENCOUNTER — Telehealth: Payer: Self-pay | Admitting: Neurology

## 2014-03-25 NOTE — Telephone Encounter (Signed)
Patient's niece [name] calling to ask whether patient needs to come in for an appointment since patient is currently in hospice care, please call and advise.

## 2014-03-28 NOTE — Telephone Encounter (Signed)
Patient in Hospice appt canceled for 03-30-14.

## 2014-03-30 ENCOUNTER — Ambulatory Visit: Payer: Medicare HMO | Admitting: Neurology

## 2014-10-10 ENCOUNTER — Other Ambulatory Visit: Payer: Self-pay | Admitting: Neurology

## 2015-07-05 ENCOUNTER — Ambulatory Visit (INDEPENDENT_AMBULATORY_CARE_PROVIDER_SITE_OTHER): Payer: Medicare HMO | Admitting: Neurology

## 2015-07-05 ENCOUNTER — Encounter: Payer: Self-pay | Admitting: Neurology

## 2015-07-05 VITALS — BP 104/57 | HR 70 | Ht 64.0 in | Wt 176.8 lb

## 2015-07-05 DIAGNOSIS — I639 Cerebral infarction, unspecified: Secondary | ICD-10-CM

## 2015-07-05 DIAGNOSIS — I6381 Other cerebral infarction due to occlusion or stenosis of small artery: Secondary | ICD-10-CM | POA: Insufficient documentation

## 2015-07-05 NOTE — Patient Instructions (Signed)
I had a long d/w patient and HPOA Manuela Schwartz about her remote stroke, risk for recurrent stroke/TIAs, personally independently reviewed imaging studies and stroke evaluation results and answered questions.Continue Plavix}  for secondary stroke prevention and maintain strict control of hypertension with blood pressure goal below 130/90, diabetes with hemoglobin A1c goal below 6.5% and lipids with LDL cholesterol goal below 70 mg/dL. I also advised the patient to eat a healthy diet with plenty of whole grains, cereals, fruits and vegetables, exercise regularly and maintain ideal body weight. Continue Keppra in the current dose of 750 mg daily for seizure prevention and I do not believe her recent behavioral and agitation changes were related to Keppra. She remains at long-term risk for recurrent seizures and needs to be on anticonvulsants for life. She was also advised home safety and fall prevention precautions. Followup in the future with me only as necessary and no routine appointment was made. Thank you for coming to see [place] at [facility]. I hope we have been able to provide you high quality care today. You may receive a patient satisfaction survey over the next few weeks. We would appreciate your feedback and comments so that we may continue to improve ourselves and the health of our patients. Fall Prevention in the Home  Falls can cause injuries and can affect people from all age groups. There are many simple things that you can do to make your home safe and to help prevent falls. WHAT CAN I DO ON THE OUTSIDE OF MY HOME?  Regularly repair the edges of walkways and driveways and fix any cracks.  Remove high doorway thresholds.  Trim any shrubbery on the main path into your home.  Use bright outdoor lighting.  Clear walkways of debris and clutter, including tools and rocks.  Regularly check that handrails are securely fastened and in good repair. Both sides of any steps should have  handrails.  Install guardrails along the edges of any raised decks or porches.  Have leaves, snow, and ice cleared regularly.  Use sand or salt on walkways during winter months.  In the garage, clean up any spills right away, including grease or oil spills. WHAT CAN I DO IN THE BATHROOM?  Use night lights.  Install grab bars by the toilet and in the tub and shower. Do not use towel bars as grab bars.  Use non-skid mats or decals on the floor of the tub or shower.  If you need to sit down while you are in the shower, use a plastic, non-slip stool.Marland Kitchen  Keep the floor dry. Immediately clean up any water that spills on the floor.  Remove soap buildup in the tub or shower on a regular basis.  Attach bath mats securely with double-sided non-slip rug tape.  Remove throw rugs and other tripping hazards from the floor. WHAT CAN I DO IN THE BEDROOM?  Use night lights.  Make sure that a bedside light is easy to reach.  Do not use oversized bedding that drapes onto the floor.  Have a firm chair that has side arms to use for getting dressed.  Remove throw rugs and other tripping hazards from the floor. WHAT CAN I DO IN THE KITCHEN?   Clean up any spills right away.  Avoid walking on wet floors.  Place frequently used items in easy-to-reach places.  If you need to reach for something above you, use a sturdy step stool that has a grab bar.  Keep electrical cables out of the  way.  Do not use floor polish or wax that makes floors slippery. If you have to use wax, make sure that it is non-skid floor wax.  Remove throw rugs and other tripping hazards from the floor. WHAT CAN I DO IN THE STAIRWAYS?  Do not leave any items on the stairs.  Make sure that there are handrails on both sides of the stairs. Fix handrails that are broken or loose. Make sure that handrails are as long as the stairways.  Check any carpeting to make sure that it is firmly attached to the stairs. Fix any  carpet that is loose or worn.  Avoid having throw rugs at the top or bottom of stairways, or secure the rugs with carpet tape to prevent them from moving.  Make sure that you have a light switch at the top of the stairs and the bottom of the stairs. If you do not have them, have them installed. WHAT ARE SOME OTHER FALL PREVENTION TIPS?  Wear closed-toe shoes that fit well and support your feet. Wear shoes that have rubber soles or low heels.  When you use a stepladder, make sure that it is completely opened and that the sides are firmly locked. Have someone hold the ladder while you are using it. Do not climb a closed stepladder.  Add color or contrast paint or tape to grab bars and handrails in your home. Place contrasting color strips on the first and last steps.  Use mobility aids as needed, such as canes, walkers, scooters, and crutches.  Turn on lights if it is dark. Replace any light bulbs that burn out.  Set up furniture so that there are clear paths. Keep the furniture in the same spot.  Fix any uneven floor surfaces.  Choose a carpet design that does not hide the edge of steps of a stairway.  Be aware of any and all pets.  Review your medicines with your healthcare provider. Some medicines can cause dizziness or changes in blood pressure, which increase your risk of falling. Talk with your health care provider about other ways that you can decrease your risk of falls. This may include working with a physical therapist or trainer to improve your strength, balance, and endurance.   This information is not intended to replace advice given to you by your health care provider. Make sure you discuss any questions you have with your health care provider.   Document Released: 07/19/2002 Document Revised: 12/13/2014 Document Reviewed: 09/02/2014 Elsevier Interactive Patient Education [employer].

## 2015-07-05 NOTE — Progress Notes (Signed)
[facility] [address] [place]. [place] [address] [phone]       OFFICE FOLLOW-UP NOTE  Ms. Hailey Faulkner Date of Birth:  Jun 23, 1930 Medical Record Number:  [id number]   HPI: 80 year Caucasian lady who is accompanied by her health power of attorney her nephew's wife Manuela Schwartz who provides most of the history reviewed she is seen today for the first follow-up visit following hospital consultation for stroke in May 2015. She was advised to follow-up in a few months but did not do so as she was transferred to hospice care but surprisingly did quite well and survived and is now currently living at home with her sister and they have a caregiver during the daytime. The patient recently had agitation and some argumentative behavior and her primary care physician felt this may possibly be Keppra effect and hence this appointment was made for disc discussion however that behavior seems to have settled down after patient's blood pressure was better controlled after increasing the dose of of Cardura to 3 mg 3 times daily as per the patient's health power of attorney. She has not had any seizures for several years. She remains on Keppra 750 once a day which she otherwise seems to be tolerating well. She needs a walker to walk but does walk at least once a day. She has been dragging her left leg more but she is fortunately had no serious falls. She remains on Plavix for stroke prevention which is tolerating well without bleeding or bruising. Her blood pressure was elevated but is now better controlled. She is not sure when last time lipid profile was checked. Patient has remote history of right temporal meningioma resection in 26 years ago and has since bony deformity at the craniotomy site. She also has vocal cord paralysis and a very hoarse voice.  ROS:   14 system review of systems is positive for  gait difficulty, hoarseness of voice, angry, argumentative, seizure and all other systems  negative  PMH:  Past Medical History  Diagnosis Date  . History of UTI   . Pancreatitis   . HBP (high blood pressure)   . OP (osteoporosis)   . Vocal cord paralysis     RIGHT VOCAL CORD  . History of malignant gastrointestinal stromal tumor (GIST)   . Hypothyroidism   . Seizure disorder ([place])   . SIADH (syndrome of inappropriate ADH production) ([place])   . Melanoma ([place])     vulvar  . Meningioma ([place])   . Gait instability   . CVA (cerebral infarction)     Social History:  Social History   Social History  . Marital Status: Widowed    Spouse Name: N/A  . Number of Children: 0  . Years of Education: 12   Occupational History  . telephone operator     retired   Social History Main Topics  . Smoking status: Never Smoker   . Smokeless tobacco: Never Used  . Alcohol Use: No  . Drug Use: No  . Sexual Activity: No   Other Topics Concern  . Not on file   Social History Narrative   Patient is widowed and lives alone.   Patient doesn't have any children.   Patient is retired.   Patient has a high school education.   Patient is right-handed.   Patient drinks 2 cups of coffee and one glass of tea daily.    Medications:   Current Outpatient Prescriptions on File Prior to Visit  Medication  Sig Dispense Refill  . acetaminophen (TYLENOL) 325 MG tablet Take 650 mg by mouth every 6 (six) hours as needed.      Marland Kitchen atenolol (TENORMIN) 100 MG tablet Take 100 mg by mouth daily.    . Calcium Carbonate-Vitamin D (CALCIUM + D PO) Take by mouth 2 (two) times daily.      . clopidogrel (PLAVIX) 75 MG tablet Take 1 tablet (75 mg total) by mouth daily with breakfast. 30 tablet   . doxazosin (CARDURA) 1 MG tablet Take 1 mg by mouth at bedtime. Take three pills once a day    . esomeprazole (NEXIUM) 40 MG capsule Take 40 mg by mouth daily before breakfast.      . levETIRAcetam (KEPPRA) 750 MG tablet TAKE 1 TABLET BY MOUTH EVERY DAY 60 tablet 0  . levothyroxine (SYNTHROID, LEVOTHROID) 125 MCG  tablet Take 125 mcg by mouth daily.      Marland Kitchen losartan (COZAAR) 100 MG tablet Take 100 mg by mouth daily.      . meclizine (ANTIVERT) 12.5 MG tablet Take 12.5 mg by mouth 3 (three) times daily as needed.      . senna-docusate (SENOKOT-S) 8.6-50 MG per tablet Take 1 tablet by mouth at bedtime as needed for mild constipation.     No current facility-administered medications on file prior to visit.    Allergies:   Allergies  Allergen Reactions  . Hctz [Hydrochlorothiazide]   . Hydrocodone     Physical Exam General: Frail elderly Caucasian lady, seated, in no evident distress Head: head shows right temporal bony skull depression at prior meningioma resection site.  Neck: supple with no carotid or supraclavicular bruits Cardiovascular: regular rate and rhythm, no murmurs Musculoskeletal: no deformity. Mild kyphosis Skin:  no rash/petichiae Vascular:  Normal pulses all extremities Filed Vitals:   07/05/15 1120  BP: 104/57  Pulse: 70   Neurologic Exam Mental Status: Awake and fully alert. Oriented to place and time. Recent and remote memory intact. Attention span, concentration and fund of knowledge appropriate. Mood and affect appropriate. Voice is extremely hoarse and at times difficult to understand due to old vocal cord palsy. Cranial Nerves: Fundoscopic exam reveals sharp disc margins. Pupils equal, briskly reactive to light. Extraocular movements full without nystagmus. Visual fields full to confrontation. Hearing intact. Facial sensation intact. Face, tongue, palate moves normally and symmetrically.  Motor: Normal bulk and tone. Normal strength in all tested extremity muscles. Sensory.: intact to touch ,pinprick .position and vibratory sensation.  Coordination: Rapid alternating movements normal in all extremities. Finger-to-nose and heel-to-shin performed accurately bilaterally. Gait and Station: Arises from chair with  difficulty. Stance is stooped.Needs a walker. Gait demonstrates  normal stride length and mild imbalance and dragging of left leg . Unable to heel, toe and tandem walk   Reflexes: 1+ and symmetric. Toes downgoing.       ASSESSMENT: 31 year Caucasian lady with the thalamic infarct in May 2015 secondary to small vessel disease and remote history of right temporal meningioma resection with seizure disorder which appears stable. Vascular risk factors of hypertension, hyperlipidemia and cerebrovascular disease  PLAN: I had a long d/w patient and HPOA Manuela Schwartz about her remote stroke, risk for recurrent stroke/TIAs, personally independently reviewed imaging studies and stroke evaluation results and answered questions.Continue Plavix}  for secondary stroke prevention and maintain strict control of hypertension with blood pressure goal below 130/90, diabetes with hemoglobin A1c goal below 6.5% and lipids with LDL cholesterol goal below 70 mg/dL. I also advised the  patient to eat a healthy diet with plenty of whole grains, cereals, fruits and vegetables, exercise regularly and maintain ideal body weight. Continue Keppra in the current dose of 750 mg daily for seizure prevention and I do not believe her recent behavioral and agitation changes were related to Keppra. She remains at long-term risk for recurrent seizures and needs to be on anticonvulsants for life. She was also advised home safety and fall prevention precautions.Greater than 50% of time during this 25 minute visit was spent on counseling,explanation of diagnosis, planning of further management, discussion with patient and family and coordination of care  Followup in the future with me only as necessary and no routine appointment was made. Antony Contras, MD  Note: This document was prepared with digital dictation and possible smart phrase technology. Any transcriptional errors that result from this process are unintentional

## 2015-07-13 ENCOUNTER — Encounter: Payer: Self-pay | Admitting: Gastroenterology

## 2016-04-10 ENCOUNTER — Other Ambulatory Visit (INDEPENDENT_AMBULATORY_CARE_PROVIDER_SITE_OTHER): Payer: Medicare HMO

## 2016-04-10 ENCOUNTER — Ambulatory Visit (INDEPENDENT_AMBULATORY_CARE_PROVIDER_SITE_OTHER): Payer: Medicare HMO | Admitting: Gastroenterology

## 2016-04-10 ENCOUNTER — Encounter (INDEPENDENT_AMBULATORY_CARE_PROVIDER_SITE_OTHER): Payer: Self-pay

## 2016-04-10 ENCOUNTER — Encounter: Payer: Self-pay | Admitting: Gastroenterology

## 2016-04-10 VITALS — BP 150/80 | HR 64 | Ht 64.0 in | Wt 170.0 lb

## 2016-04-10 DIAGNOSIS — Z8669 Personal history of other diseases of the nervous system and sense organs: Secondary | ICD-10-CM

## 2016-04-10 DIAGNOSIS — Z85820 Personal history of malignant melanoma of skin: Secondary | ICD-10-CM

## 2016-04-10 DIAGNOSIS — R634 Abnormal weight loss: Secondary | ICD-10-CM | POA: Insufficient documentation

## 2016-04-10 DIAGNOSIS — R1013 Epigastric pain: Secondary | ICD-10-CM

## 2016-04-10 DIAGNOSIS — Z87898 Personal history of other specified conditions: Secondary | ICD-10-CM | POA: Insufficient documentation

## 2016-04-10 LAB — COMPREHENSIVE METABOLIC PANEL
ALBUMIN: 4 g/dL (ref 3.5–5.2)
ALK PHOS: 71 U/L (ref 39–117)
ALT: 10 U/L (ref 0–35)
AST: 14 U/L (ref 0–37)
BILIRUBIN TOTAL: 0.3 mg/dL (ref 0.2–1.2)
BUN: 14 mg/dL (ref 6–23)
CALCIUM: 9 mg/dL (ref 8.4–10.5)
CO2: 30 meq/L (ref 19–32)
CREATININE: 0.99 mg/dL (ref 0.40–1.20)
Chloride: 108 mEq/L (ref 96–112)
GFR: 56.77 mL/min — AB (ref >60.00)
Glucose, Bld: 123 mg/dL — ABNORMAL HIGH (ref 70–99)
Potassium: 4.9 mEq/L (ref 3.5–5.1)
Sodium: 144 mEq/L (ref 135–145)
TOTAL PROTEIN: 7 g/dL (ref 6.0–8.3)

## 2016-04-10 LAB — CBC WITH DIFFERENTIAL/PLATELET
BASOS ABS: 0.1 10*3/uL (ref 0.0–0.1)
BASOS PCT: 1.1 % (ref 0.0–3.0)
EOS ABS: 0.2 10*3/uL (ref 0.0–0.7)
Eosinophils Relative: 2.9 % (ref 0.0–5.0)
HEMATOCRIT: 36.9 % (ref 36.0–46.0)
HEMOGLOBIN: 12.7 g/dL (ref 12.0–15.0)
LYMPHS PCT: 28.6 % (ref 12.0–46.0)
Lymphs Abs: 1.6 10*3/uL (ref 0.7–4.0)
MCHC: 34.3 g/dL (ref 30.0–36.0)
MCV: 90.8 fl (ref 78.0–100.0)
Monocytes Absolute: 0.4 10*3/uL (ref 0.1–1.0)
Monocytes Relative: 7.8 % (ref 3.0–12.0)
Neutro Abs: 3.2 10*3/uL (ref 1.4–7.7)
Neutrophils Relative %: 59.6 % (ref 43.0–77.0)
Platelets: 212 10*3/uL (ref 150.0–400.0)
RBC: 4.07 Mil/uL (ref 3.87–5.11)
RDW: 13.9 % (ref 11.5–15.5)
WBC: 5.4 10*3/uL (ref 4.0–10.5)

## 2016-04-10 LAB — LIPASE: Lipase: 23 U/L (ref 11.0–59.0)

## 2016-04-10 NOTE — Patient Instructions (Addendum)
Your physician has requested that you go to the basement for lab work before leaving today.  You have been scheduled for a CT scan of the abdomen and pelvis at [facility] ([address]---this is in the same building as Press [facility]).   You are scheduled 10 am on Tuesday 04/16/16. You should arrive 15 minutes prior to your appointment time for registration. Please follow the written instructions below on the day of your exam:  WARNING: IF YOU ARE ALLERGIC TO IODINE/X-RAY DYE, PLEASE NOTIFY RADIOLOGY IMMEDIATELY AT [phone]! YOU WILL BE GIVEN A 13 HOUR PREMEDICATION PREP.  1) Do not eat or drink anything after  (4 hours prior to your test) 2) You have been given 2 bottles of oral contrast to drink. The solution may taste better if refrigerated, but do NOT add ice or any other liquid to this solution. Shake well before drinking.    Drink 1 bottle of contrast @ 8 am  (2 hours prior to your exam)  Drink 1 bottle of contrast @ 9 am (1 hour prior to your exam)  You may take any medications as prescribed with a small amount of water except for the following: Metformin, Glucophage, Glucovance, Avandamet, Riomet, Fortamet, Actoplus Met, Janumet, Glumetza or Metaglip. The above medications must be held the day of the exam AND 48 hours after the exam.  The purpose of you drinking the oral contrast is to aid in the visualization of your intestinal tract. The contrast solution may cause some diarrhea. Before your exam is started, you will be given a small amount of fluid to drink. Depending on your individual set of symptoms, you may also receive an intravenous injection of x-ray contrast/dye. Plan on being at [facility] for 30 minutes or longer, depending on the type of exam you are having performed.  This test typically takes 30-45 minutes to complete.  If you have any questions regarding your exam or if you need to reschedule, you may call the CT department at [phone]  between the hours of 8:00 am and 5:00 pm, Monday-Friday.  ________________________________________________________________________

## 2016-04-10 NOTE — Progress Notes (Signed)
Reviewed and agree with management plan.  Westyn Driggers T. Takerra Lupinacci, MD FACG 

## 2016-04-10 NOTE — Progress Notes (Signed)
04/10/2016 Hailey Faulkner [id number] 22-Sep-1929   HISTORY OF PRESENT ILLNESS:  This is an 71 female who has been referred to our office by her PCP, Dr. Nyoka Faulkner, for evaluation of weight loss. Apparently she has lost about 6 pounds recently. She has a history of stage IV melanoma, gastrointestinal stromal tumor, and a benign brain tumor that was removed in the 90s. She is widowed and does not have any children. Her nephew's wife, Hailey Faulkner, cares for her and has been doing so for the past 10 years. She is with Hailey Faulkner at her visit today. Apparently Hailey Faulkner has also been complaining of some epigastric abdominal pain recently as well.  Hailey Faulkner tells me that [place] has to really work hard to eat and requires thickened liquids, pured food, etc.  She has poor appetite.  Thinks that she is "just tired", meaning that she has been through a lot.  During the visit Hailey Faulkner adamantly expresses that she does not want to be put to sleep for any procedure.   Past Medical History:  Diagnosis Date  . CVA (cerebral infarction)   . Gait instability   . HBP (high blood pressure)   . History of malignant gastrointestinal stromal tumor (GIST)   . History of UTI   . Hypothyroidism   . Melanoma ([place])    vulvar  . Meningioma ([place])   . OP (osteoporosis)   . Pancreatitis   . Seizure disorder ([place])   . SIADH (syndrome of inappropriate ADH production) ([place])   . Vocal cord paralysis    RIGHT VOCAL CORD   Past Surgical History:  Procedure Laterality Date  . ABDOMINAL HYSTERECTOMY    . BRAIN TUMOR EXCISION    . CATARACT EXTRACTION, BILATERAL  2011  . CHOLECYSTECTOMY  10/2007  . GASTRECTOMY  2007  . LARYNGOPLASTY    . MELANOMA EXCISION  2011   stage 4  . THYROIDECTOMY, PARTIAL  1970  . WRIST FRACTURE SURGERY Left     reports that she has never smoked. She has never used smokeless tobacco. She reports that she does not drink alcohol or use drugs. family history includes Brain cancer in her brother; Breast  cancer in her sister; Heart attack in her brother; Heart failure in her brother; Leukemia in her sister; Lung cancer in her sister; Stroke in her father. Allergies  Allergen Reactions  . Hctz [Hydrochlorothiazide]   . Hydrocodone       Outpatient Encounter Prescriptions as of 04/10/2016  Medication Sig  . acetaminophen (TYLENOL) 325 MG tablet Take 650 mg by mouth every 6 (six) hours as needed.    . ALPRAZolam (XANAX) 0.25 MG tablet TAKE 1/2 TO 1 TABLET THREE TIMES DAILY AS NEEDED.  . Calcium Carbonate-Vitamin D (CALCIUM + D PO) Take by mouth daily.   . clopidogrel (PLAVIX) 75 MG tablet Take 1 tablet (75 mg total) by mouth daily with breakfast.  . doxazosin (CARDURA) 1 MG tablet Take 1 mg by mouth at bedtime. Take three pills once a day  . levETIRAcetam (KEPPRA) 750 MG tablet TAKE 1 TABLET BY MOUTH EVERY DAY  . levothyroxine (SYNTHROID, LEVOTHROID) 100 MCG tablet Take 100 mcg by mouth daily before breakfast.  . losartan (COZAAR) 100 MG tablet Take 100 mg by mouth daily.    . meclizine (ANTIVERT) 12.5 MG tablet Take 12.5 mg by mouth 3 (three) times daily as needed.    . metoprolol (LOPRESSOR) 100 MG tablet Take 100 mg by mouth daily.  . pantoprazole (PROTONIX) 40  MG tablet Take 40 mg by mouth 2 (two) times daily.  . polyethylene glycol powder (GLYCOLAX/MIRALAX) powder 1 Container. Take 1 capful daily  . senna-docusate (SENOKOT-S) 8.6-50 MG per tablet Take 1 tablet by mouth at bedtime as needed for mild constipation.  . [DISCONTINUED] atenolol (TENORMIN) 100 MG tablet Take 100 mg by mouth daily.  . [DISCONTINUED] esomeprazole (NEXIUM) 40 MG capsule Take 40 mg by mouth daily before breakfast.    . [DISCONTINUED] levothyroxine (SYNTHROID, LEVOTHROID) 125 MCG tablet Take 125 mcg by mouth daily.     No facility-administered encounter medications on file as of 04/10/2016.      REVIEW OF SYSTEMS  : All other systems reviewed and negative except where noted in the History of Present  Illness.   PHYSICAL EXAM: BP (!) 150/80   Pulse 64   Ht 5\' 4"  (1.626 m)   Wt 170 lb (77.1 kg)   BMI 29.18 kg/m  General:  Elderly white female in no acute distress Head: Normocephalic and atraumatic Eyes:  Sclerae anicteric, conjunctiva pink. Ears: Normal auditory acuity Lungs: Clear throughout to auscultation Heart: Regular rate and rhythm Abdomen: Soft, non-distended.  Normal bowel sounds.  Epigastric TTP. Musculoskeletal: Symmetrical with no gross deformities  Skin: No lesions on visible extremities Extremities: No edema  Neurological: Alert oriented x 4, grossly non-focal Psychological:  Alert and cooperative. Normal mood and affect  ASSESSMENT AND PLAN: -Weight loss, approximately 6 pounds recently:  Patient has history of stage IV melanoma as well as a benign brain tumor and GIST tumor in the past.  Also with epigastric abdominal pain.  Patient adamantly relays her wishes of not being put to sleep for any type of procedure. Both she and her caregiver are agreeable to CT scan of the abdomen and pelvis with contrast. Will proceed with this.  We will check CBC, CMP, and lipase as well. I think that some of her weight loss is due to poor appetite which may be secondary to depression. She has difficulty eating and really struggles physically to eat, requiring thickened liquids, etc.     CC:  Hailey Erp, MD

## 2016-04-16 ENCOUNTER — Ambulatory Visit (INDEPENDENT_AMBULATORY_CARE_PROVIDER_SITE_OTHER)
Admission: RE | Admit: 2016-04-16 | Discharge: 2016-04-16 | Disposition: A | Discharge status: Home / Self Care | Payer: Medicare HMO | Source: Ambulatory Visit | Attending: Gastroenterology | Admitting: Gastroenterology

## 2016-04-16 DIAGNOSIS — R634 Abnormal weight loss: Secondary | ICD-10-CM | POA: Diagnosis not present

## 2016-04-16 DIAGNOSIS — Z87898 Personal history of other specified conditions: Secondary | ICD-10-CM

## 2016-04-16 DIAGNOSIS — Z85820 Personal history of malignant melanoma of skin: Secondary | ICD-10-CM

## 2016-04-16 DIAGNOSIS — R1013 Epigastric pain: Secondary | ICD-10-CM

## 2016-04-16 MED ORDER — IOPAMIDOL (ISOVUE-300) INJECTION 61%
100.0000 mL | Freq: Once | INTRAVENOUS | Status: AC | PRN
  Administered 2016-04-16: 100 mL via INTRAVENOUS

## 2016-12-10 DEATH — deceased
